# Patient Record
Sex: Female | Born: 1945 | Race: Black or African American | Hispanic: No | Smoking: Former smoker
Health system: Southern US, Community
[De-identification: ages and names within clinical notes are randomized; demographics above are authoritative.]

## PROBLEM LIST (undated history)

## (undated) DIAGNOSIS — C50919 Malignant neoplasm of unspecified site of unspecified female breast: Secondary | ICD-10-CM

## (undated) DIAGNOSIS — J302 Other seasonal allergic rhinitis: Secondary | ICD-10-CM

## (undated) DIAGNOSIS — E119 Type 2 diabetes mellitus without complications: Secondary | ICD-10-CM

## (undated) DIAGNOSIS — C801 Malignant (primary) neoplasm, unspecified: Secondary | ICD-10-CM

## (undated) DIAGNOSIS — I10 Essential (primary) hypertension: Secondary | ICD-10-CM

## (undated) DIAGNOSIS — Z923 Personal history of irradiation: Secondary | ICD-10-CM

## (undated) HISTORY — DX: Type 2 diabetes mellitus without complications: E11.9

## (undated) HISTORY — DX: Other seasonal allergic rhinitis: J30.2

## (undated) HISTORY — PX: WISDOM TOOTH EXTRACTION: SHX21

## (undated) HISTORY — PX: SALPINGOOPHORECTOMY: SHX82

## (undated) HISTORY — PX: BREAST BIOPSY: SHX20

## (undated) HISTORY — DX: Essential (primary) hypertension: I10

## (undated) HISTORY — DX: Malignant (primary) neoplasm, unspecified: C80.1

---

## 1998-12-08 ENCOUNTER — Other Ambulatory Visit: Admission: RE | Admit: 1998-12-08 | Discharge: 1998-12-08 | Payer: Self-pay | Admitting: Internal Medicine

## 1999-11-27 ENCOUNTER — Other Ambulatory Visit: Admission: RE | Admit: 1999-11-27 | Discharge: 1999-11-27 | Payer: Self-pay | Admitting: Family Medicine

## 2001-03-25 ENCOUNTER — Other Ambulatory Visit: Admission: RE | Admit: 2001-03-25 | Discharge: 2001-03-25 | Payer: Self-pay | Admitting: Obstetrics and Gynecology

## 2001-10-01 DIAGNOSIS — C801 Malignant (primary) neoplasm, unspecified: Secondary | ICD-10-CM

## 2001-10-01 HISTORY — PX: BREAST LUMPECTOMY: SHX2

## 2001-10-01 HISTORY — DX: Malignant (primary) neoplasm, unspecified: C80.1

## 2001-11-21 ENCOUNTER — Encounter: Admission: RE | Admit: 2001-11-21 | Discharge: 2001-11-21 | Payer: Self-pay | Admitting: General Surgery

## 2001-11-21 ENCOUNTER — Encounter: Payer: Self-pay | Admitting: General Surgery

## 2001-12-16 ENCOUNTER — Encounter: Payer: Self-pay | Admitting: General Surgery

## 2001-12-16 ENCOUNTER — Encounter: Admission: RE | Admit: 2001-12-16 | Discharge: 2001-12-16 | Payer: Self-pay | Admitting: General Surgery

## 2001-12-18 ENCOUNTER — Ambulatory Visit (HOSPITAL_BASED_OUTPATIENT_CLINIC_OR_DEPARTMENT_OTHER): Admission: RE | Admit: 2001-12-18 | Discharge: 2001-12-18 | Payer: Self-pay | Admitting: General Surgery

## 2001-12-18 ENCOUNTER — Encounter (INDEPENDENT_AMBULATORY_CARE_PROVIDER_SITE_OTHER): Payer: Self-pay | Admitting: *Deleted

## 2001-12-26 ENCOUNTER — Ambulatory Visit: Admission: RE | Admit: 2001-12-26 | Discharge: 2002-03-26 | Payer: Self-pay | Admitting: Radiation Oncology

## 2002-01-02 ENCOUNTER — Inpatient Hospital Stay (HOSPITAL_COMMUNITY): Admission: EM | Admit: 2002-01-02 | Discharge: 2002-01-04 | Payer: Self-pay | Admitting: Emergency Medicine

## 2002-01-02 ENCOUNTER — Encounter: Payer: Self-pay | Admitting: Emergency Medicine

## 2002-01-03 ENCOUNTER — Encounter: Payer: Self-pay | Admitting: Internal Medicine

## 2002-08-12 ENCOUNTER — Encounter: Payer: Self-pay | Admitting: General Surgery

## 2002-08-12 ENCOUNTER — Encounter: Admission: RE | Admit: 2002-08-12 | Discharge: 2002-08-12 | Payer: Self-pay | Admitting: Internal Medicine

## 2003-01-26 ENCOUNTER — Other Ambulatory Visit: Admission: RE | Admit: 2003-01-26 | Discharge: 2003-01-26 | Payer: Self-pay | Admitting: Obstetrics and Gynecology

## 2003-08-20 ENCOUNTER — Encounter: Admission: RE | Admit: 2003-08-20 | Discharge: 2003-08-20 | Payer: Self-pay | Admitting: Internal Medicine

## 2003-10-15 ENCOUNTER — Other Ambulatory Visit: Admission: RE | Admit: 2003-10-15 | Discharge: 2003-10-15 | Payer: Self-pay | Admitting: Obstetrics and Gynecology

## 2004-03-17 ENCOUNTER — Other Ambulatory Visit: Admission: RE | Admit: 2004-03-17 | Discharge: 2004-03-17 | Payer: Self-pay | Admitting: Obstetrics and Gynecology

## 2004-10-20 ENCOUNTER — Encounter: Admission: RE | Admit: 2004-10-20 | Discharge: 2004-10-20 | Payer: Self-pay | Admitting: General Surgery

## 2005-01-02 ENCOUNTER — Ambulatory Visit: Payer: Self-pay | Admitting: Gastroenterology

## 2005-01-18 ENCOUNTER — Ambulatory Visit: Payer: Self-pay | Admitting: Gastroenterology

## 2005-07-27 ENCOUNTER — Other Ambulatory Visit: Admission: RE | Admit: 2005-07-27 | Discharge: 2005-07-27 | Payer: Self-pay | Admitting: Obstetrics and Gynecology

## 2005-11-19 ENCOUNTER — Encounter: Admission: RE | Admit: 2005-11-19 | Discharge: 2005-11-19 | Payer: Self-pay | Admitting: General Surgery

## 2005-12-10 ENCOUNTER — Encounter: Admission: RE | Admit: 2005-12-10 | Discharge: 2005-12-10 | Payer: Self-pay | Admitting: General Surgery

## 2005-12-17 ENCOUNTER — Emergency Department (HOSPITAL_COMMUNITY): Admission: EM | Admit: 2005-12-17 | Discharge: 2005-12-17 | Payer: Self-pay | Admitting: Emergency Medicine

## 2006-07-29 ENCOUNTER — Other Ambulatory Visit: Admission: RE | Admit: 2006-07-29 | Discharge: 2006-07-29 | Payer: Self-pay | Admitting: Obstetrics and Gynecology

## 2006-12-16 ENCOUNTER — Encounter: Admission: RE | Admit: 2006-12-16 | Discharge: 2006-12-16 | Payer: Self-pay | Admitting: General Surgery

## 2007-01-27 ENCOUNTER — Encounter: Admission: RE | Admit: 2007-01-27 | Discharge: 2007-01-27 | Payer: Self-pay | Admitting: General Surgery

## 2007-07-17 ENCOUNTER — Encounter: Admission: RE | Admit: 2007-07-17 | Discharge: 2007-07-17 | Payer: Self-pay | Admitting: Internal Medicine

## 2007-07-31 ENCOUNTER — Other Ambulatory Visit: Admission: RE | Admit: 2007-07-31 | Discharge: 2007-07-31 | Payer: Self-pay | Admitting: Obstetrics and Gynecology

## 2008-02-13 ENCOUNTER — Encounter: Admission: RE | Admit: 2008-02-13 | Discharge: 2008-02-13 | Payer: Self-pay | Admitting: Internal Medicine

## 2008-11-11 ENCOUNTER — Other Ambulatory Visit: Admission: RE | Admit: 2008-11-11 | Discharge: 2008-11-11 | Payer: Self-pay | Admitting: Obstetrics and Gynecology

## 2009-02-18 ENCOUNTER — Encounter: Admission: RE | Admit: 2009-02-18 | Discharge: 2009-02-18 | Payer: Self-pay | Admitting: Internal Medicine

## 2009-12-20 ENCOUNTER — Encounter (INDEPENDENT_AMBULATORY_CARE_PROVIDER_SITE_OTHER): Payer: Self-pay | Admitting: *Deleted

## 2010-01-09 ENCOUNTER — Encounter (INDEPENDENT_AMBULATORY_CARE_PROVIDER_SITE_OTHER): Payer: Self-pay | Admitting: *Deleted

## 2010-03-28 ENCOUNTER — Encounter (INDEPENDENT_AMBULATORY_CARE_PROVIDER_SITE_OTHER): Payer: Self-pay | Admitting: *Deleted

## 2010-03-30 ENCOUNTER — Ambulatory Visit: Payer: Self-pay | Admitting: Gastroenterology

## 2010-03-30 ENCOUNTER — Encounter (INDEPENDENT_AMBULATORY_CARE_PROVIDER_SITE_OTHER): Payer: Self-pay | Admitting: *Deleted

## 2010-04-05 ENCOUNTER — Encounter (INDEPENDENT_AMBULATORY_CARE_PROVIDER_SITE_OTHER): Payer: Self-pay | Admitting: *Deleted

## 2010-04-07 ENCOUNTER — Encounter: Admission: RE | Admit: 2010-04-07 | Discharge: 2010-04-07 | Payer: Self-pay | Admitting: Internal Medicine

## 2010-07-25 ENCOUNTER — Encounter (INDEPENDENT_AMBULATORY_CARE_PROVIDER_SITE_OTHER): Payer: Self-pay | Admitting: *Deleted

## 2010-09-27 ENCOUNTER — Ambulatory Visit: Payer: Self-pay | Admitting: Gastroenterology

## 2010-10-04 ENCOUNTER — Ambulatory Visit: Admit: 2010-10-04 | Payer: Self-pay | Admitting: Gastroenterology

## 2010-10-31 NOTE — Letter (Signed)
Summary: Pre Visit Letter Revised  [facility]  [address] [place], [place] [address]   Phone: [phone]  Fax: [phone]        07/25/2010 MRN: [id number] RIANN OMAN [address] [address] [place], [place]  [address]             Procedure Date:  09/08/2010   Welcome to the Gastroenterology Division at [facility].    You are scheduled to see a nurse for your pre-procedure visit on 09/01/2010 at 8:00AM on the 3rd floor at [facility], [address]  We ask that you try to arrive at our office 15 minutes prior to your appointment time to allow for check-in.  Please take a minute to review the attached form.  If you answer "Yes" to one or more of the questions on the first page, we ask that you call the person listed at your earliest opportunity.  If you answer "No" to all of the questions, please complete the rest of the form and bring it to your appointment.    Your nurse visit will consist of discussing your medical and surgical history, your immediate family medical history, and your medications.   If you are unable to list all of your medications on the form, please bring the medication bottles to your appointment and we will list them.  We will need to be aware of both prescribed and over the counter drugs.  We will need to know exact dosage information as well.    Please be prepared to read and sign documents such as consent forms, a financial agreement, and acknowledgement forms.  If necessary, and with your consent, a friend or relative is welcome to sit-in on the nurse visit with you.  Please bring your insurance card so that we may make a copy of it.  If your insurance requires a referral to see a specialist, please bring your referral form from your primary care physician.  No co-pay is required for this nurse visit.     If you cannot keep your appointment, please call [phone] to cancel or reschedule prior to your appointment date.  This allows  [place] the opportunity to schedule an appointment for another patient in need of care.    Thank you for choosing  Gastroenterology for your medical needs.  We appreciate the opportunity to care for you.  Please visit [place] at our website  to learn more about our practice.  Sincerely, The Gastroenterology Division

## 2010-10-31 NOTE — Letter (Signed)
Summary: Diabetic Instructions  [facility]  [address] [place], [place] [address]   Phone: [phone]  Fax: [phone]    TICARA WANER 1946-09-01 MRN: [id number]   _  _   ORAL DIABETIC MEDICATION INSTRUCTIONS  The day before your procedure:   Take your diabetic pill as you do normally  The day of your procedure:   Do not take your diabetic pill    We will check your blood sugar levels during the admission process and again in Recovery before discharging you home  ________________________________________________________________________  _  _   INSULIN (LONG ACTING) MEDICATION INSTRUCTIONS (Lantus, NPH, 70/30, Humulin, Novolin-N)   The day before your procedure:   Take  your regular evening dose    The day of your procedure:   Do not take your morning dose

## 2010-10-31 NOTE — Letter (Signed)
Summary: Previsit letter  [facility]  [address] [place], [place] [address]   Phone: [phone]  Fax: [phone]       01/09/2010 MRN: [id number]  Holly Osborn [address] [place], [place]  [address]  Dear Ms. Robarge,  Welcome to the Gastroenterology Division at [facility].    You are scheduled to see a nurse for your pre-procedure visit on 02-21-10 at 8:00a.m. on the 3rd floor at [facility], [address].  We ask that you try to arrive at our office 15 minutes prior to your appointment time to allow for check-in.  Your nurse visit will consist of discussing your medical and surgical history, your immediate family medical history, and your medications.    Please bring a complete list of all your medications or, if you prefer, bring the medication bottles and we will list them.  We will need to be aware of both prescribed and over the counter drugs.  We will need to know exact dosage information as well.  If you are on blood thinners (Coumadin, Plavix, Aggrenox, Ticlid, etc.) please call our office today/prior to your appointment, as we need to consult with your physician about holding your medication.   Please be prepared to read and sign documents such as consent forms, a financial agreement, and acknowledgement forms.  If necessary, and with your consent, a friend or relative is welcome to sit-in on the nurse visit with you.  Please bring your insurance card so that we may make a copy of it.  If your insurance requires a referral to see a specialist, please bring your referral form from your primary care physician.  No co-pay is required for this nurse visit.     If you cannot keep your appointment, please call [phone] to cancel or reschedule prior to your appointment date.  This allows [place] the opportunity to schedule an appointment for another patient in need of care.    Thank you for choosing [facility] for your medical needs.   We appreciate the opportunity to care for you.  Please visit [place] at our website  to learn more about our practice.                     Sincerely.                                                                                                                   The Gastroenterology Division

## 2010-10-31 NOTE — Letter (Signed)
Summary: [facility] Instructions  [facility]  [address] [place], [place] [address]   Phone: [phone]  Fax: [phone]       Holly Osborn    September 26, 1946    MRN: [id number]        Procedure Day Dorna Bloom: Lenor Coffin  04/13/10     Arrival Time: 7:30am     Procedure Time: 8:30am     Location of Procedure:                    _ X_  [facility] (4th Floor)                       PREPARATION FOR COLONOSCOPY WITH MOVIPREP   Starting 5 days prior to your procedure  SATURDAY 07/09  do not eat nuts, seeds, popcorn, corn, beans, peas,  salads, or any raw vegetables.  Do not take any fiber supplements (e.g. Metamucil, Citrucel, and Benefiber).  THE DAY BEFORE YOUR PROCEDURE         DATE: [facility]  07/13  1.  Drink clear liquids the entire day-NO SOLID FOOD  2.  Do not drink anything colored red or purple.  Avoid juices with pulp.  No orange juice.  3.  Drink at least 64 oz. (8 glasses) of fluid/clear liquids during the day to prevent dehydration and help the prep work efficiently.  CLEAR LIQUIDS INCLUDE: Water Jello Ice Popsicles Tea (sugar ok, no milk/cream) Powdered fruit flavored drinks Coffee (sugar ok, no milk/cream) Gatorade Juice: apple, white grape, white cranberry  Lemonade Clear bullion, consomm, broth Carbonated beverages (any kind) Strained chicken noodle soup Hard Candy                             4.  In the morning, mix first dose of MoviPrep solution:    Empty 1 Pouch A and 1 Pouch B into the disposable container    Add lukewarm drinking water to the top line of the container. Mix to dissolve    Refrigerate (mixed solution should be used within 24 hrs)  5.  Begin drinking the prep at 5:00 p.m. The MoviPrep container is divided by 4 marks.   Every 15 minutes drink the solution down to the next mark (approximately 8 oz) until the full liter is complete.   6.  Follow completed prep with 16 oz of clear liquid of your choice  (Nothing red or purple).  Continue to drink clear liquids until bedtime.  7.  Before going to bed, mix second dose of MoviPrep solution:    Empty 1 Pouch A and 1 Pouch B into the disposable container    Add lukewarm drinking water to the top line of the container. Mix to dissolve    Refrigerate  THE DAY OF YOUR PROCEDURE      DATE: THURSDAY  07/14  Beginning at  3:30 a.m. (5 hours before procedure):         1. Every 15 minutes, drink the solution down to the next mark (approx 8 oz) until the full liter is complete.  2. Follow completed prep with 16 oz. of clear liquid of your choice.    3. You may drink clear liquids until 6:30am  (2 HOURS BEFORE PROCEDURE).   MEDICATION INSTRUCTIONS  Unless otherwise instructed, you should take regular prescription medications with a small sip of water   as early as possible the morning  of your procedure.  Diabetic patients - see separate instructions.   Additional medication instructions: Do not take Losartan/HCTZ day of procedure.         OTHER INSTRUCTIONS  You will need a responsible adult at least 63 years of age to accompany you and drive you home.   This person must remain in the waiting room during your procedure.  Wear loose fitting clothing that is easily removed.  Leave jewelry and other valuables at home.  However, you may wish to bring a book to read or  an iPod/MP3 player to listen to music as you wait for your procedure to start.  Remove all body piercing jewelry and leave at home.  Total time from sign-in until discharge is approximately 2-3 hours.  You should go home directly after your procedure and rest.  You can resume normal activities the  day after your procedure.  The day of your procedure you should not:   Drive   Make legal decisions   Operate machinery   Drink alcohol   Return to work  You will receive specific instructions about eating, activities and medications before you leave.    The  above instructions have been reviewed and explained to me by  Ezra Sites RN  March 30, 2010 9:24 AM    I fully understand and can verbalize these instructions _____________________________ Date _________

## 2010-10-31 NOTE — Letter (Signed)
Summary: LEC Cancel and No Reschedule  [facility]  [address] [place], [place] [address]   Phone: [phone]  Fax: [phone]      April 05, 2010 MRN: [id number]   PAISLEIGH MARONEY [address] [place], [place]  [address]     You recently cancelled your endoscopic procedure at the [facility] and did not reschedule for another date.    Your provider recommended this procedure for the benefit of your health.  It is very important that you reschedule it.  Failure to do so may be to the detriment of your health.  Please call us at [phone] and we will be happy to assist you with rescheduling.    If you were referred for this procedure by another physician/provider, we will notify him/her that you did not keep your appointment.   Sincerely,   [facility]

## 2010-10-31 NOTE — Miscellaneous (Signed)
Summary: LEC PV  Clinical Lists Changes  Medications: Added new medication of MOVIPREP 100 GM  SOLR (PEG-KCL-NACL-NASULF-NA ASC-C) As per prep instructions. - Signed Rx of MOVIPREP 100 GM  SOLR (PEG-KCL-NACL-NASULF-NA ASC-C) As per prep instructions.;  #1 x 0;  Signed;  Entered by: Ezra Sites RN;  Authorized by: Louis Meckel MD;  Method used: [facility]  [address] [phone]*, [address] [place], [place], [place]  [id number], Ph: [phone] or [phone], Fax: [phone] Observations: Added new observation of NKA: T (03/30/2010 8:56)    Prescriptions: MOVIPREP 100 GM  SOLR (PEG-KCL-NACL-NASULF-NA ASC-C) As per prep instructions.  #1 x 0   Entered by:   Ezra Sites RN   Authorized by:   Louis Meckel MD   Signed by:   Ezra Sites RN on 03/30/2010   Method used:   [facility]  [address] [phone]* (retail)       [address]       [place], [place]  [address]       Ph: [phone] or [phone]       Fax: [phone]   RxID:   [id number]

## 2010-10-31 NOTE — Letter (Signed)
Summary: Colonoscopy Letter  [facility]  [address] [place], [place] [address]   Phone: [phone]  Fax: [phone]      December 20, 2009 MRN: [id number]   Holly Osborn [address] [place], [place]  [address]   Dear [name],   According to your medical record, it is time for you to schedule a Colonoscopy. The American Cancer Society recommends this procedure as a method to detect early colon cancer. Patients with a family history of colon cancer, or a personal history of colon polyps or inflammatory bowel disease are at increased risk.  This letter has been generated based on the recommendations made at the time of your procedure. If you feel that in your particular situation this may no longer apply, please contact our office.  Please call our office at [phone] to schedule this appointment or to update your records at your earliest convenience.  Thank you for cooperating with [place] to provide you with the very best care possible.   Sincerely,  Barbette Hair. Arlyce Dice, M.D.  [facility]astro[facility] [phone]

## 2010-11-13 ENCOUNTER — Encounter (INDEPENDENT_AMBULATORY_CARE_PROVIDER_SITE_OTHER): Payer: Self-pay | Admitting: *Deleted

## 2010-11-15 ENCOUNTER — Encounter: Payer: Self-pay | Admitting: Gastroenterology

## 2010-11-15 ENCOUNTER — Encounter (INDEPENDENT_AMBULATORY_CARE_PROVIDER_SITE_OTHER): Payer: Self-pay | Admitting: *Deleted

## 2010-11-22 NOTE — Letter (Signed)
Summary: Diabetic Instructions  [facility]  [address] [place], [place] [address]   Phone: [phone]  Fax: [phone]    Holly Osborn 1945-12-19 MRN: [id number]   (Metformin)   ORAL DIABETIC MEDICATION INSTRUCTIONS  The day before your procedure:   Take your diabetic pill as you do normally  The day of your procedure:   Do not take your diabetic pill    We will check your blood sugar levels during the admission process and again in Recovery before discharging you home  ________________________________________________________________________    INSULIN (LONG ACTING) MEDICATION INSTRUCTIONS (Lantus, NPH, 70/30, Humulin, Novolin-N)   The day before your procedure:   Take  your regular evening dose    The day of your procedure:   Do not take your morning dose

## 2010-11-22 NOTE — Letter (Signed)
Summary: [facility] Instructions  [facility]  [address] [place], [place] [address]   Phone: [phone]  Fax: [phone]       Holly Osborn    12/10/1945    MRN: [id number]        Procedure Day Dorna Bloom: Wednesday 11-29-10     Arrival Time: 7:30 am     Procedure Time: 8:30 am     Location of Procedure:                    _x _  [facility] (4th Floor)   PREPARATION FOR COLONOSCOPY WITH MOVIPREP   Starting 5 days prior to your procedure  11-24-10  do not eat nuts, seeds, popcorn, corn, beans, peas,  salads, or any raw vegetables.  Do not take any fiber supplements (e.g. Metamucil, Citrucel, and Benefiber).  THE DAY BEFORE YOUR PROCEDURE         DATE:  11-28-10 DAY: Tuesday   1.  Drink clear liquids the entire day-NO SOLID FOOD  2.  Do not drink anything colored red or purple.  Avoid juices with pulp.  No orange juice.  3.  Drink at least 64 oz. (8 glasses) of fluid/clear liquids during the day to prevent dehydration and help the prep work efficiently.  CLEAR LIQUIDS INCLUDE: Water Jello Ice Popsicles Tea (sugar ok, no milk/cream) Powdered fruit flavored drinks Coffee (sugar ok, no milk/cream) Gatorade Juice: apple, white grape, white cranberry  Lemonade Clear bullion, consomm, broth Carbonated beverages (any kind) Strained chicken noodle soup Hard Candy                             4.  In the morning, mix first dose of MoviPrep solution:    Empty 1 Pouch A and 1 Pouch B into the disposable container    Add lukewarm drinking water to the top line of the container. Mix to dissolve    Refrigerate (mixed solution should be used within 24 hrs)  5.  Begin drinking the prep at 5:00 p.m. The MoviPrep container is divided by 4 marks.   Every 15 minutes drink the solution down to the next mark (approximately 8 oz) until the full liter is complete.   6.  Follow completed prep with 16 oz of clear liquid of your choice (Nothing red or purple).   Continue to drink clear liquids until bedtime.  7.  Before going to bed, mix second dose of MoviPrep solution:    Empty 1 Pouch A and 1 Pouch B into the disposable container    Add lukewarm drinking water to the top line of the container. Mix to dissolve    Refrigerate  THE DAY OF YOUR PROCEDURE      DATE:  11-29-10  DAY: Wednesday  Beginning at  3:30 a.m. (5 hours before procedure):         1. Every 15 minutes, drink the solution down to the next mark (approx 8 oz) until the full liter is complete.  2. Follow completed prep with 16 oz. of clear liquid of your choice.    3. You may drink clear liquids until  6:30 a.m. (2 HOURS BEFORE PROCEDURE).   MEDICATION INSTRUCTIONS  Unless otherwise instructed, you should take regular prescription medications with a small sip of water   as early as possible the morning of your procedure.  Diabetic patients - see separate instructions.   Additional medication instructions:  Do not take Losartan/HCTZ day of procedure.         OTHER INSTRUCTIONS  You will need a responsible adult at least 63 years of age to accompany you and drive you home.   This person must remain in the waiting room during your procedure.  Wear loose fitting clothing that is easily removed.  Leave jewelry and other valuables at home.  However, you may wish to bring a book to read or  an iPod/MP3 player to listen to music as you wait for your procedure to start.  Remove all body piercing jewelry and leave at home.  Total time from sign-in until discharge is approximately 2-3 hours.  You should go home directly after your procedure and rest.  You can resume normal activities the  day after your procedure.  The day of your procedure you should not:   Drive   Make legal decisions   Operate machinery   Drink alcohol   Return to work  You will receive specific instructions about eating, activities and medications before you leave.    The above  instructions have been reviewed and explained to me by   Ezra Sites RN  November 15, 2010 9:30 AM     I fully understand and can verbalize these instructions _____________________________ Date _________

## 2010-11-22 NOTE — Miscellaneous (Signed)
Summary: LEC PV  Clinical Lists Changes  Medications: Added new medication of MOVIPREP 100 GM  SOLR (PEG-KCL-NACL-NASULF-NA ASC-C) As per prep instructions. - Signed Rx of MOVIPREP 100 GM  SOLR (PEG-KCL-NACL-NASULF-NA ASC-C) As per prep instructions.;  #1 x 0;  Signed;  Entered by: Ezra Sites RN;  Authorized by: Louis Meckel MD;  Method used: [facility]  [address] [phone]*, [address] [place], [place], [place]  [id number], Ph: [phone] or [phone], Fax: [phone] Observations: Added new observation of NKA: T (11/15/2010 9:02)    Prescriptions: MOVIPREP 100 GM  SOLR (PEG-KCL-NACL-NASULF-NA ASC-C) As per prep instructions.  #1 x 0   Entered by:   Ezra Sites RN   Authorized by:   Louis Meckel MD   Signed by:   Ezra Sites RN on 11/15/2010   Method used:   [facility] to        [facility]  [address] [phone]* (retail)       [address]       [place], [place]  [id number]       Ph: [phone] or [phone]       Fax: [phone]   RxID:   [id number]

## 2010-11-29 ENCOUNTER — Other Ambulatory Visit: Payer: Self-pay | Admitting: Gastroenterology

## 2010-11-29 ENCOUNTER — Other Ambulatory Visit (AMBULATORY_SURGERY_CENTER): Payer: BC Managed Care – PPO | Admitting: Gastroenterology

## 2010-11-29 DIAGNOSIS — Z86010 Personal history of colonic polyps: Secondary | ICD-10-CM

## 2010-11-29 DIAGNOSIS — Z1211 Encounter for screening for malignant neoplasm of colon: Secondary | ICD-10-CM

## 2010-11-29 DIAGNOSIS — Z800 Family history of malignant neoplasm of digestive organs: Secondary | ICD-10-CM

## 2010-11-29 DIAGNOSIS — D126 Benign neoplasm of colon, unspecified: Secondary | ICD-10-CM

## 2010-11-29 LAB — GLUCOSE, CAPILLARY: Glucose-Capillary: 215 mg/dL — ABNORMAL HIGH (ref 70–99)

## 2010-12-04 ENCOUNTER — Encounter: Payer: Self-pay | Admitting: Gastroenterology

## 2010-12-07 NOTE — Procedures (Addendum)
Summary: Colonoscopy  Patient: Holly Osborn Note: All result statuses are Final unless otherwise noted.  Tests: (1) Colonoscopy (COL)   COL Colonoscopy           DONE     [facility]     [address]     [place], [place]  [address]          COLONOSCOPY PROCEDURE REPORT          PATIENT:  Holly Osborn, Holly Osborn  MR#:  [id number]     BIRTHDATE:  October 30, 1945, 64 yrs. old  GENDER:  female          ENDOSCOPIST:  Barbette Hair. Arlyce Dice, MD     Referred by:  Chilton Greathouse, M.D.          PROCEDURE DATE:  11/29/2010     PROCEDURE:  Colon with cold biopsy polypectomy     ASA CLASS:  Class II     INDICATIONS:  1) screening  2) family history of colon cancer  3)     history of pre-cancerous (adenomatous) colon polyps Father and     brother          MEDICATIONS:   Fentanyl 100 mcg IV, Versed 10 mg IV          DESCRIPTION OF PROCEDURE:   After the risks benefits and     alternatives of the procedure were thoroughly explained, informed     consent was obtained.  Digital rectal exam was performed and     revealed no abnormalities.   The LB CF-[id number] endoscope     was introduced through the anus and advanced to the cecum, which     was identified by both the appendix and ileocecal valve, without     limitations.  The quality of the prep was good, using MoviPrep.     The instrument was then slowly withdrawn as the colon was fully     examined.     <<PROCEDUREIMAGES>>          FINDINGS:  A sessile polyp was found in the ascending colon. It     was 3 mm in size. The polyp was removed using cold biopsy forceps     (see image8).  Moderate diverticulosis was found in the ascending     colon (see image3).  Moderate diverticulosis was found in the     sigmoid colon (see image12).  Scattered diverticula were found.     descending to transverse colon  This was otherwise a normal     examination of the colon (see image5, image6, image9, image10,     image14, and image15).    Retroflexed views in the rectum revealed     no abnormalities.    The time to cecum =  4.50  minutes. The scope     was then withdrawn (time =  11.25  min) from the patient and the     procedure completed.          COMPLICATIONS:  None          ENDOSCOPIC IMPRESSION:     1) 3 mm sessile polyp in the ascending colon     2) Moderate diverticulosis in the ascending colon     3) Moderate diverticulosis in the sigmoid colon     4) Diverticula, scattered     5) Otherwise normal examination     RECOMMENDATIONS:     1) Given your significant family history of colon cancer, you  should have a repeat colonoscopy in 5 years          REPEAT EXAM:   5 year(s) Colonoscopy          ______________________________     Barbette Hair. Arlyce Dice, MD          CC:          n.     eSIGNED:   Barbette Hair. Kaplan at 11/29/2010 09:25 AM          Pixler, Junious Dresser, [id number]  Note: An exclamation mark (!) indicates a result that was not dispersed into the flowsheet. Document Creation Date: 11/29/2010 9:25 AM _______________________________________________________________________  (1) Order result status: Final Collection or observation date-time: 11/29/2010 09:15 Requested date-time:  Receipt date-time:  Reported date-time:  Referring Physician:   Ordering Physician: Melvia Heaps [phone]) Specimen Source:  Source: Launa Grill Order Number: [id number] Lab site:   Appended Document: Colonoscopy     Procedures Next Due Date:    Colonoscopy: 11/2015

## 2010-12-12 NOTE — Letter (Signed)
Summary: Results Letter   [facility]  [address] [place], [place] [address]   Phone: [phone]  Fax: [phone]        December 04, 2010 MRN: [id number]    [name] [address] [place], [place]  [address]    Dear Holly Osborn,  Your colon biopsy results did not show any remarkable findings.  In view of your family history of colon cancer, however, I recommend followup colonoscopy in 5 years.  Please continue with the recommendations previously discussed.  Should you have any further questions or immediate concers, feel free to contact me.  Sincerely,  Barbette Hair. Arlyce Dice, M.D., [facility]          Sincerely,  Louis Meckel MD  This letter has been electronically signed by your physician.  Appended Document: Results Letter letter mailed

## 2011-02-16 NOTE — Discharge Summary (Signed)
[place]. [facility]  Patient:    Holly Osborn, Holly Osborn Visit Number: [id number] MRN: [id number]          Service Type: MED Location: [id number] Attending Physician:  Hoyle Sauer Dictated by:   Gwen Pounds, M.D. Admit Date:  01/02/2002 Discharge Date: 01/04/2002   CC:         Ravi R. Felipa Eth, M.D., primary care  Rose Phi. Maple Hudson, M.D.   Discharge Summary  DISCHARGE DIAGNOSES: 1. Right upper lobe community acquired pneumonia. 2. Mild hemoptysis. 3. Tobacco abuse now in the process of quitting. 4. Seasonal allergic rhinitis. 5. Diverticulosis. 6. Colon polyps. 7. Degenerative joint disease. 8. Hypertension with diastolic dysfunction and left ventricular hypertrophy. 9. History of Pagets breast disease, status post resection of high grade    malignancy by Rose Phi. Young, M.D., in March 2003, with pending therapy    to consist of radiation therapy and possibly tamoxifen.  DISCHARGE MEDICATIONS: 1. Azithromycin 500 mg p.o. q.d. for two more days. 2. Augmentin 875 mg p.o. b.i.d. x11 more days. 3. Tiazac 300 q.d. 4. Allegra 60 q.d. 5. Celebrex 100 q.d. 6. Avalide 300/12.5 q.d. to be restarted in one week. 7. Tylenol and/or increase Celebrex for pleuritic chest pain.  DISCHARGE PROCEDURES:  Chest x-ray and CT scan.  The CT scan was a helical scan dated January 03, 2002, revealing right upper lobe pneumonia with tiny right pleural effusion.  HISTORY OF PRESENT ILLNESS:  Briefly, The patient is a very pleasant 65 year old African American female with history of tobacco abuse and diastolic dysfunction who is status post excision of Pagets breast disease who presented for medical attention on January 02, 2002, with two or three day progressive history of pleuritic chest pain associated with cough, dyspnea, fever and myalgias.  Also associated, here nausea, vomiting, and diarrhea. The patient was worked up and it was noted that she had leukocytosis and  a right upper lobe community acquired pneumonia.  HOSPITAL COURSE:  The patient was admitted to the hospital with community acquired pneumonia. She was given IV fluids, IV antibiotics and oxygen. She progressed nicely.  She developed hemoptysis and a helical CT scan was obtained which did not show any malignancy or lymphadenopathy. It only showed the right upper lobe pneumonia with a tiny right pleural effusion.  On the date of January 04, 2002, she was up moving about in her room.  Her oxygen saturations were in the mid to upper 90s.  Her leukocytosis was improving. Her white blood cell count on the date of discharge was 13.9 with much less of a left shift.  It was decided on January 04, 2002, that she was improving markedly, that she could easily switched over to oral antibiotics, and that she could be discharged safely.  She is being discharged in stable condition.  DISCHARGE ACTIVITY:  She is to increase her activity slowly.  AFTER CARE FOLLOW-UP INSTRUCTIONS:  She is to continue off cigarettes and never smoke again.  She is to follow up with Ravi R. Avva, M.D., in about seven to 14 days or sooner if she worsens.  She is to have a chest x-ray after the pneumonia is resolved to prove resolution. Dictated by:   Gwen Pounds, M.D. Attending Physician:  Hoyle Sauer DD:  01/04/02 TD:  01/05/02 Job: [id number]

## 2011-02-16 NOTE — H&P (Signed)
[place]. [facility]  Patient:    Holly Osborn, Holly Osborn Visit Number: [id number] MRN: [id number]          Service Type: EMS Location: [place] Attending Physician:  Doug Sou Dictated by:   Lennon Alstrom. Felipa Eth, M.D. Admit Date:  01/02/2002   CC:         Rose Phi. Maple Hudson, M.D.   History and Physical  CHIEF COMPLAINT:  Nausea, vomiting, diarrhea, myalgias, dyspnea, and fever progressive x2-3 days.  HISTORY OF PRESENT ILLNESS:  This is a 65 year old African-American female with a history of tobacco abuse, degenerative joint disease, seasonal allergic rhinitis, hypertension with diastolic dysfunction, and a recent diagnosis of Pagets disease status post excision by Dr. Francina Ames in March 2003.  Biopsy margins were clean of any malignancy.  Patient now presents with a progressive two to three-day history of pleuritic chest pain associated with minimal cough, dyspnea, fever, and myalgias associated with nausea, vomiting, and diarrhea.  The patient states that she has been off of her tobacco for approximately two weeks but has been plagued by increasing seasonal allergic rhinitis symptoms for the last one week.  The patient presented to the emergency room where evaluation revealed leukocytosis and a right upper lobe pneumonia.  Blood cultures x2 were obtained and patient was started empirically on azithromycin and Rocephin for community-acquired pneumonia, and she is now admitted for further management and care on an inpatient basis.  REVIEW OF SYSTEMS:  Positive for fevers, chills, malaise, nausea, vomiting, diarrhea without blood, and myalgias.  Review of systems is negative for chest pain, dysuria, frequency, abdominal pain, or new musculoskeletal deficits. Patient states that she has not had problems with the excision status post surgery for the resection of her Pagets disease from the breast.  SOCIAL HISTORY:  The patient divorced approximately 30 years ago, is  an Environmental health practitioner at [employer].  Smokes approximately one pack per day x18 years but has quit over the last two weeks. Has occasional use of alcohol.  FAMILY HISTORY:  Significant for colon cancer, hypertension, dementia, questionable brain tumor.  PROBLEM LIST: 1. Tobacco abuse. 2. History of bilateral tubal ligation. 3. Seasonal allergic rhinitis. 4. Family history of colon cancer with the last colonoscopy in March 2003 by    Dr. Arlyce Dice revealing diverticulosis and colon polyps. 5. 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 6. Hypertension associated with diastolic dysfunction and left ventricular    hypertrophy by echocardiogram. 7. Pagets disease of the breast status post resection of a high-grade    malignancy by Dr. Francina Ames in March 2003 with pending therapy to    consist of radiation therapy and possibly Tamoxifen.  CURRENT MEDICATIONS: 1. Allegra 60 mg p.o. q.d. 2. Celebrex 100 mg p.o. q.d. 3. Cyclobenzaprine 10 mg q.d. 4. Alprazolam 0.5 mg p.r.n. 5. Tiazac 300 mg p.o. q.d. 6. Avalide 300 mg/12.5 mg one p.o. q.d.  ALLERGIES:  No known drug allergies but a history of KLONODINE resulting in fatigue.  PHYSICAL EXAMINATION:  GENERAL:  We have an obese African-American female who expressed general malaise but has no respiratory distress on room air and talking in full sentences.  VITAL SIGNS:  Blood pressure 138/56, temperature 101.5 degrees Fahrenheit, respirations 22 and nonlabored, oxygen saturation 92% on room air, pulse 80 and regular.  HEENT:  Head:  Normocephalic, atraumatic.  Eye:  Sclerae anicteric. Extraocular movements are intact and pupils are equal and reactive to light and accommodation.  ENT:  There are no  oropharyngeal lesions or sinus tenderness.  NECK:  Supple.  There is no thyromegaly, no cervical lymphadenopathy.  LUNGS:  Reveal coarse breath sounds with mild rhonchi right greater than left, but no  bronchospasm.  CARDIOVASCULAR:  Reveals a regular rate and rhythm.  ABDOMEN:  Reveals a soft, nontender, nondistended abdomen with bowel sounds present throughout.  GENITOURINARY AND RECTAL:  Deferred.  EXTREMITIES:  Reveal no edema with 2+ pulses in all four extremities.  NEUROLOGIC:  Grossly nonfocal.  LABORATORY DATA:  Unremarkable urinalysis.  Sodium 137, potassium 3.2, BUN 12, creatinine 1.2, glucose 156, total protein 7.7, albumin 3.4, AST 33, ALT 53, alkaline phosphatase 105, total bilirubin 1.2.  White blood cell count 19.3, elevated with a left shift consisting of 91% neutrophils; hemoglobin 13.7, hematocrit 39.5%, platelet count 256.  Portable chest x-ray reveals right upper lobe infiltrate.  Blood cultures x2 have been obtained in the emergency room prior to the initiation of azithromycin and Rocephin.  ASSESSMENT AND PLAN: 1. We have community-acquired pneumonia with a history of tobacco abuse.  Will    follow up on blood cultures x2.  Check O2 saturation each shift x48 hours    for the possibility of hypoxia.  Will continue azithromycin and Rocephin    empirically and observe for bronchospasm.  Will provide mucolytic agents. 2. Hypokalemia, probably secondary to nausea, vomiting, and diarrhea.  Will    provide IV fluids consisting of half normal saline with 20 mEq of KCl per    liter and give a one-time dose of K-Dur orally, and will recheck in the    morning. 3. Hypertension with diastolic dysfunction.  Will continue calcium channel    blocker with Tiazac and with the possibility of re-adding Avalide if blood    pressure exceeds normal limits. 4. Stress management.  Will continue Xanax as needed to include use for    insomnia if this occurs during hospitalization. 5. Myalgias.  Will continue Celebrex, especially in light of her degenerative    joint disease. 6. Seasonal allergic rhinitis.  Will continue Allegra; however, will increase     the dose to 180 mg  each day and add Rhinocort Aqua nasal spray, and observe    for improvement. Dictated by:   Lennon Alstrom Felipa Eth, M.D. Attending Physician:  Doug Sou DD:  01/02/02 TD:  01/02/02 Job: [id number]

## 2011-02-16 NOTE — Op Note (Signed)
[place]. [facility]  Patient:    DARSHANA, CURNUTT Visit Number: [id number] MRN: [id number]          Service Type: DSU Location: [facility] Attending Physician:  Janalyn Rouse Dictated by:   Rose Phi. Maple Hudson, M.D. Proc. Date: 12/18/01 Admit Date:  12/18/2001   CC:         Ravi R. Felipa Eth, M.D.  [facility] [place], [place]   Operative Report  PREOPERATIVE DIAGNOSIS:  Padgets disease of the right nipple.  POSTOPERATIVE DIAGNOSIS:  Padgets disease of the right nipple.  OPERATION PERFORMED:  Right partial mastectomy.  SURGEON:  Rose Phi. Maple Hudson, M.D.  ANESTHESIA:  General.  DESCRIPTION OF PROCEDURE:  This patient had presented with an excoriation of the right nipple, which on biopsy had shown Padgets disease.  Careful mammography had shown some linear and pleomorphic microcalcifications just in the subareolar portion of the right breast with no other abnormalities within the breast.  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  After suitable general anesthesia was induced, the patient was placed in supine position with the right arm extended on the arm board.  The right breast was prepped and draped in the usual fashion.  An elliptical incision incorporating the nipple and the very large areola that she had was then outlined and incision made and then I took the nipple areolar complex and the deeper tissue going at least midway down to the depths of the breast. Hemostasis was obtained with the cautery.  I injected with 0.25% Marcaine. The deeper tissue was then approximated with 3-0 Vicryl and then a subcuticular closure with 4-0 Monocryl was carried out along with Steri-Strips.  Dressings were then applied.  The patient was transferred to the  recovery room in satisfactory condition having tolerated the procedure well. Dictated by:   Rose Phi. Maple Hudson, M.D. Attending Physician:  Janalyn Rouse DD:  12/18/01 TD:  12/18/01 Job: [id number]

## 2011-03-20 ENCOUNTER — Other Ambulatory Visit: Payer: Self-pay | Admitting: Internal Medicine

## 2011-03-20 DIAGNOSIS — Z1231 Encounter for screening mammogram for malignant neoplasm of breast: Secondary | ICD-10-CM

## 2011-04-11 ENCOUNTER — Ambulatory Visit
Admission: RE | Admit: 2011-04-11 | Discharge: 2011-04-11 | Disposition: A | Discharge status: Home / Self Care | Payer: BC Managed Care – PPO | Source: Ambulatory Visit | Attending: Internal Medicine | Admitting: Internal Medicine

## 2011-04-11 DIAGNOSIS — Z1231 Encounter for screening mammogram for malignant neoplasm of breast: Secondary | ICD-10-CM

## 2012-01-23 ENCOUNTER — Other Ambulatory Visit: Payer: Self-pay | Admitting: Internal Medicine

## 2012-01-23 DIAGNOSIS — N644 Mastodynia: Secondary | ICD-10-CM

## 2012-01-28 ENCOUNTER — Other Ambulatory Visit: Payer: BC Managed Care – PPO

## 2012-02-01 ENCOUNTER — Other Ambulatory Visit: Payer: BC Managed Care – PPO

## 2012-02-06 ENCOUNTER — Ambulatory Visit
Admission: RE | Admit: 2012-02-06 | Discharge: 2012-02-06 | Disposition: A | Discharge status: Home / Self Care | Payer: BC Managed Care – PPO | Source: Ambulatory Visit | Attending: Internal Medicine | Admitting: Internal Medicine

## 2012-02-06 DIAGNOSIS — N644 Mastodynia: Secondary | ICD-10-CM

## 2012-03-20 ENCOUNTER — Other Ambulatory Visit: Payer: Self-pay | Admitting: Internal Medicine

## 2012-03-20 DIAGNOSIS — Z1231 Encounter for screening mammogram for malignant neoplasm of breast: Secondary | ICD-10-CM

## 2012-04-21 ENCOUNTER — Ambulatory Visit: Payer: BC Managed Care – PPO

## 2012-04-29 ENCOUNTER — Ambulatory Visit: Payer: BC Managed Care – PPO

## 2012-05-05 ENCOUNTER — Ambulatory Visit
Admission: RE | Admit: 2012-05-05 | Discharge: 2012-05-05 | Disposition: A | Discharge status: Home / Self Care | Payer: BC Managed Care – PPO | Source: Ambulatory Visit | Attending: Internal Medicine | Admitting: Internal Medicine

## 2012-05-05 DIAGNOSIS — Z1231 Encounter for screening mammogram for malignant neoplasm of breast: Secondary | ICD-10-CM

## 2013-04-24 ENCOUNTER — Other Ambulatory Visit: Payer: Self-pay

## 2013-04-24 DIAGNOSIS — Z1231 Encounter for screening mammogram for malignant neoplasm of breast: Secondary | ICD-10-CM

## 2013-05-15 ENCOUNTER — Ambulatory Visit: Payer: BC Managed Care – PPO

## 2013-06-26 ENCOUNTER — Ambulatory Visit: Payer: BC Managed Care – PPO

## 2013-07-24 ENCOUNTER — Ambulatory Visit
Admission: RE | Admit: 2013-07-24 | Discharge: 2013-07-24 | Disposition: A | Discharge status: Home / Self Care | Payer: Medicare Other | Source: Ambulatory Visit

## 2013-07-24 DIAGNOSIS — Z1231 Encounter for screening mammogram for malignant neoplasm of breast: Secondary | ICD-10-CM

## 2013-07-28 ENCOUNTER — Other Ambulatory Visit: Payer: Self-pay | Admitting: Internal Medicine

## 2013-07-28 DIAGNOSIS — R928 Other abnormal and inconclusive findings on diagnostic imaging of breast: Secondary | ICD-10-CM

## 2013-09-02 ENCOUNTER — Ambulatory Visit
Admission: RE | Admit: 2013-09-02 | Discharge: 2013-09-02 | Disposition: A | Discharge status: Home / Self Care | Payer: Medicare Other | Source: Ambulatory Visit | Attending: Internal Medicine | Admitting: Internal Medicine

## 2013-09-02 DIAGNOSIS — R928 Other abnormal and inconclusive findings on diagnostic imaging of breast: Secondary | ICD-10-CM

## 2014-08-05 ENCOUNTER — Other Ambulatory Visit: Payer: Self-pay

## 2014-08-05 DIAGNOSIS — Z1231 Encounter for screening mammogram for malignant neoplasm of breast: Secondary | ICD-10-CM

## 2014-09-13 ENCOUNTER — Ambulatory Visit
Admission: RE | Admit: 2014-09-13 | Discharge: 2014-09-13 | Disposition: A | Discharge status: Home / Self Care | Payer: 59 | Source: Ambulatory Visit

## 2014-09-13 DIAGNOSIS — Z1231 Encounter for screening mammogram for malignant neoplasm of breast: Secondary | ICD-10-CM

## 2014-10-20 ENCOUNTER — Other Ambulatory Visit: Payer: Self-pay | Admitting: Dermatology

## 2015-01-20 ENCOUNTER — Encounter: Payer: Self-pay | Admitting: Gastroenterology

## 2015-08-10 ENCOUNTER — Other Ambulatory Visit: Payer: Self-pay

## 2015-08-10 DIAGNOSIS — Z1231 Encounter for screening mammogram for malignant neoplasm of breast: Secondary | ICD-10-CM

## 2015-08-19 ENCOUNTER — Encounter: Payer: Self-pay | Admitting: Gastroenterology

## 2015-08-31 ENCOUNTER — Ambulatory Visit: Payer: Self-pay | Admitting: Podiatry

## 2015-09-13 ENCOUNTER — Encounter: Payer: Self-pay | Admitting: Podiatry

## 2015-09-13 ENCOUNTER — Ambulatory Visit (INDEPENDENT_AMBULATORY_CARE_PROVIDER_SITE_OTHER): Payer: Medicare Other | Admitting: Podiatry

## 2015-09-13 VITALS — BP 158/69 | HR 72 | Resp 12

## 2015-09-13 DIAGNOSIS — L609 Nail disorder, unspecified: Secondary | ICD-10-CM

## 2015-09-13 DIAGNOSIS — L608 Other nail disorders: Secondary | ICD-10-CM

## 2015-09-13 DIAGNOSIS — E119 Type 2 diabetes mellitus without complications: Secondary | ICD-10-CM | POA: Diagnosis not present

## 2015-09-13 NOTE — Progress Notes (Signed)
   Subjective:    Patient ID: Holly Osborn, female    DOB: 1946/05/08, 69 y.o.   MRN: [id number]  HPI    This diabetic patient presents today complaining of long toenails that are uncomfortable when she walks and wear shoes. The symptoms are chronic and in the past patient has visit pedicures for nail trimming. She denies any history of foot infection or podiatric care. She especially uncomfortable in the medial margin of the left hallux toenail  Patient denies any history of foot ulceration, claudication or amputation  Review of Systems  HENT: Positive for sinus pressure.   Musculoskeletal: Positive for myalgias and back pain.       Objective:   Physical Exam  Orientated 3  Vascular: DP and PT pulses 2/4 bilaterally Capillary reflex immediate bilaterally  Neurological: Sensation to 10 g monofilament wire intact 5/5 bilaterally Vibratory sensation reactive bilaterally Ankle reflex equal and reactive bilaterally  Dermatological: No open skin lesions bilaterally The toenails are elongated and incurvated 6-10 Callus medial nail groove left hallux Plantar keratoses sub-second and third MPJ right with minimal thickening and appear to have been trimmed to her sanded  Musculoskeletal: Hammertoe deformities 2-4 bilaterally There is no crepitus or pain upon range of motion of ankle, subtalar, midtarsal joints bilaterally      Assessment & Plan:   Assessment: Diabetic without complications Incurvated toenails 6-10 Callus medial nail groove left hallux Plantar calluses bilaterally  Plan: Today review sedimentation with patient today. I did debrided toenails mechanically an electrical without a bleeding. I made aware that she could go to the pedicurist as long she did not soak her feet in whirlpool bath or half the cuticle was manipulated  Reappoint when necessary or yearly

## 2015-09-13 NOTE — Patient Instructions (Signed)
Today your diabetic foot examination demonstrated adequate circulation and feeling in your feet The toenails are mildly curled with a callused nail groove on the left Okay to have nails trimmed at the salon, do not use whirlpool and do not manipulate the cuticles Return as needed or yearly Diabetes and Foot Care Diabetes may cause you to have problems because of poor blood supply (circulation) to your feet and legs. This may cause the skin on your feet to become thinner, break easier, and heal more slowly. Your skin may become dry, and the skin may peel and crack. You may also have nerve damage in your legs and feet causing decreased feeling in them. You may not notice minor injuries to your feet that could lead to infections or more serious problems. Taking care of your feet is one of the most important things you can do for yourself.  HOME CARE INSTRUCTIONS  Wear shoes at all times, even in the house. Do not go barefoot. Bare feet are easily injured.  Check your feet daily for blisters, cuts, and redness. If you cannot see the bottom of your feet, use a mirror or ask someone for help.  Wash your feet with warm water (do not use hot water) and mild soap. Then pat your feet and the areas between your toes until they are completely dry. Do not soak your feet as this can dry your skin.  Apply a moisturizing lotion or petroleum jelly (that does not contain alcohol and is unscented) to the skin on your feet and to dry, brittle toenails. Do not apply lotion between your toes.  Trim your toenails straight across. Do not dig under them or around the cuticle. File the edges of your nails with an emery board or nail file.  Do not cut corns or calluses or try to remove them with medicine.  Wear clean socks or stockings every day. Make sure they are not too tight. Do not wear knee-high stockings since they may decrease blood flow to your legs.  Wear shoes that fit properly and have enough cushioning. To  break in new shoes, wear them for just a few hours a day. This prevents you from injuring your feet. Always look in your shoes before you put them on to be sure there are no objects inside.  Do not cross your legs. This may decrease the blood flow to your feet.  If you find a minor scrape, cut, or break in the skin on your feet, keep it and the skin around it clean and dry. These areas may be cleansed with mild soap and water. Do not cleanse the area with peroxide, alcohol, or iodine.  When you remove an adhesive bandage, be sure not to damage the skin around it.  If you have a wound, look at it several times a day to make sure it is healing.  Do not use heating pads or hot water bottles. They may burn your skin. If you have lost feeling in your feet or legs, you may not know it is happening until it is too late.  Make sure your health care provider performs a complete foot exam at least annually or more often if you have foot problems. Report any cuts, sores, or bruises to your health care provider immediately. SEEK MEDICAL CARE IF:   You have an injury that is not healing.  You have cuts or breaks in the skin.  You have an ingrown nail.  You notice redness on your  legs or feet.  You feel burning or tingling in your legs or feet.  You have pain or cramps in your legs and feet.  Your legs or feet are numb.  Your feet always feel cold. SEEK IMMEDIATE MEDICAL CARE IF:   There is increasing redness, swelling, or pain in or around a wound.  There is a red line that goes up your leg.  Pus is coming from a wound.  You develop a fever or as directed by your health care provider.  You notice a bad smell coming from an ulcer or wound.   This information is not intended to replace advice given to you by your health care provider. Make sure you discuss any questions you have with your health care provider.   Document Released: 09/14/2000 Document Revised: 05/20/2013 Document  Reviewed: 02/24/2013 Elsevier Interactive Patient Education [employer].

## 2015-09-16 ENCOUNTER — Ambulatory Visit
Admission: RE | Admit: 2015-09-16 | Discharge: 2015-09-16 | Disposition: A | Discharge status: Home / Self Care | Payer: Medicare Other | Source: Ambulatory Visit

## 2015-09-16 DIAGNOSIS — Z1231 Encounter for screening mammogram for malignant neoplasm of breast: Secondary | ICD-10-CM

## 2015-11-25 ENCOUNTER — Encounter: Payer: Self-pay | Admitting: Gastroenterology

## 2015-12-27 ENCOUNTER — Encounter: Payer: Self-pay | Admitting: Gastroenterology

## 2016-02-15 ENCOUNTER — Ambulatory Visit (AMBULATORY_SURGERY_CENTER): Payer: Self-pay

## 2016-02-15 VITALS — Ht 70.0 in | Wt 218.4 lb

## 2016-02-15 DIAGNOSIS — Z860100 Personal history of colon polyps, unspecified: Secondary | ICD-10-CM

## 2016-02-15 MED ORDER — SUPREP BOWEL PREP KIT 17.5-3.13-1.6 GM/177ML PO SOLN: 1.0000 | Freq: Once | ORAL | Status: DC

## 2016-02-15 NOTE — Progress Notes (Signed)
No allergies to eggs or soy No diet meds No home oxygen No past problems with anesthesia  Has email and internet; declined emmi 

## 2016-02-16 ENCOUNTER — Encounter: Payer: Self-pay | Admitting: Gastroenterology

## 2016-02-29 ENCOUNTER — Encounter: Payer: Self-pay | Admitting: Gastroenterology

## 2016-02-29 ENCOUNTER — Ambulatory Visit (AMBULATORY_SURGERY_CENTER): Payer: Medicare Other | Admitting: Gastroenterology

## 2016-02-29 VITALS — BP 151/73 | HR 68 | Temp 97.5°F | Resp 14 | Ht 70.0 in | Wt 218.0 lb

## 2016-02-29 DIAGNOSIS — Z860100 Personal history of colon polyps, unspecified: Secondary | ICD-10-CM

## 2016-02-29 DIAGNOSIS — D123 Benign neoplasm of transverse colon: Secondary | ICD-10-CM

## 2016-02-29 DIAGNOSIS — D128 Benign neoplasm of rectum: Secondary | ICD-10-CM | POA: Diagnosis not present

## 2016-02-29 DIAGNOSIS — K633 Ulcer of intestine: Secondary | ICD-10-CM | POA: Diagnosis not present

## 2016-02-29 DIAGNOSIS — K635 Polyp of colon: Secondary | ICD-10-CM | POA: Diagnosis not present

## 2016-02-29 DIAGNOSIS — K621 Rectal polyp: Secondary | ICD-10-CM

## 2016-02-29 LAB — GLUCOSE, CAPILLARY
GLUCOSE-CAPILLARY: 160 mg/dL — AB (ref 65–99)
GLUCOSE-CAPILLARY: 121 mg/dL — AB (ref 65–99)

## 2016-02-29 MED ORDER — SODIUM CHLORIDE 0.9 % IV SOLN: 500.0000 mL | INTRAVENOUS | Status: DC

## 2016-02-29 NOTE — Progress Notes (Signed)
Report to PACU, RN, vss, BBS= Clear.  

## 2016-02-29 NOTE — Patient Instructions (Signed)
YOU HAD AN ENDOSCOPIC PROCEDURE TODAY AT [facility]:   Refer to the procedure report that was given to you for any specific questions about what was found during the examination.  If the procedure report does not answer your questions, please call your gastroenterologist to clarify.  If you requested that your care partner not be given the details of your procedure findings, then the procedure report has been included in a sealed envelope for you to review at your convenience later.  YOU SHOULD EXPECT: Some feelings of bloating in the abdomen. Passage of more gas than usual.  Walking can help get rid of the air that was put into your GI tract during the procedure and reduce the bloating. If you had a lower endoscopy (such as a colonoscopy or flexible sigmoidoscopy) you may notice spotting of blood in your stool or on the toilet paper. If you underwent a bowel prep for your procedure, you may not have a normal bowel movement for a few days.  Please Note:  You might notice some irritation and congestion in your nose or some drainage.  This is from the oxygen used during your procedure.  There is no need for concern and it should clear up in a day or so.  SYMPTOMS TO REPORT IMMEDIATELY:   Following lower endoscopy (colonoscopy or flexible sigmoidoscopy):  Excessive amounts of blood in the stool  Significant tenderness or worsening of abdominal pains  Swelling of the abdomen that is new, acute  Fever of 100F or higher   For urgent or emergent issues, a gastroenterologist can be reached at any hour by calling [phone].   DIET: Your first meal following the procedure should be a small meal and then it is ok to progress to your normal diet. Heavy or fried foods are harder to digest and may make you feel nauseous or bloated.  Likewise, meals heavy in dairy and vegetables can increase bloating.  Drink plenty of fluids but you should avoid alcoholic beverages for 24  hours.  ACTIVITY:  You should plan to take it easy for the rest of today and you should NOT DRIVE or use heavy machinery until tomorrow (because of the sedation medicines used during the test).    FOLLOW UP: Our staff will call the number listed on your records the next business day following your procedure to check on you and address any questions or concerns that you may have regarding the information given to you following your procedure. If we do not reach you, we will leave a message.  However, if you are feeling well and you are not experiencing any problems, there is no need to return our call.  We will assume that you have returned to your regular daily activities without incident.  If any biopsies were taken you will be contacted by phone or by letter within the next 1-3 weeks.  Please call us at [phone] if you have not heard about the biopsies in 3 weeks.    SIGNATURES/CONFIDENTIALITY: You and/or your care partner have signed paperwork which will be entered into your electronic medical record.  These signatures attest to the fact that that the information above on your After Visit Summary has been reviewed and is understood.  Full responsibility of the confidentiality of this discharge information lies with you and/or your care-partner.  Polyp, diverticulosis, high fiber diet information given.  Hemorrhoid information given.  NO NSAIDS FOR 3 MONTHS.  Baby aspirin and tylenol OK per  Dr. Silverio Decamp.

## 2016-02-29 NOTE — Op Note (Signed)
[place] Patient Name: Holly Osborn Procedure Date: 02/29/2016 9:43 AM MRN: [id number] Endoscopist: Mauri Pole , MD Age: 70 Referring MD:  Date of Birth: 01-07-1946 Gender: Female Procedure:                Colonoscopy Indications:              Surveillance: Personal history of adenomatous                            polyps on last colonoscopy 5 years ago Medicines:                Monitored Anesthesia Care Procedure:                Pre-Anesthesia Assessment:                           - Prior to the procedure, a History and Physical                            was performed, and patient medications and                            allergies were reviewed. The patient's tolerance of                            previous anesthesia was also reviewed. The risks                            and benefits of the procedure and the sedation                            options and risks were discussed with the patient.                            All questions were answered, and informed consent                            was obtained. Prior Anticoagulants: The patient has                            taken no previous anticoagulant or antiplatelet                            agents. ASA Grade Assessment: II - A patient with                            mild systemic disease. After reviewing the risks                            and benefits, the patient was deemed in                            satisfactory condition to undergo the procedure.  After obtaining informed consent, the colonoscope                            was passed under direct vision. Throughout the                            procedure, the patient's blood pressure, pulse, and                            oxygen saturations were monitored continuously. The                            Model CF-HQ190L [id number]) scope was introduced                            through the anus and advanced to the the  cecum,                            identified by appendiceal orifice and ileocecal                            valve. The colonoscopy was performed without                            difficulty. The patient tolerated the procedure                            well. The quality of the bowel preparation was                            good. The ileocecal valve, appendiceal orifice, and                            rectum were photographed. Scope In: 9:51:52 AM Scope Out: 10:15:38 AM Scope Withdrawal Time: 0 hours 14 minutes 9 seconds  Total Procedure Duration: 0 hours 23 minutes 46 seconds  Findings:                 The perianal and digital rectal examinations were                            normal.                           Two sessile polyps were found in the rectum and                            transverse colon. The polyps were 9 to 12 mm in                            size. These polyps were removed with a hot snare.                            Resection and retrieval were complete.                           ~  72mm area of nonbleeding ulcerated mucosa with                            stigmata of recent bleeding were present in the                            ascending colon. Biopsies were taken with a cold                            forceps for histology.                           Multiple small and large-mouthed diverticula were                            found in the sigmoid colon, descending colon and                            ascending colon.                           Non-bleeding internal hemorrhoids were found during                            retroflexion. The hemorrhoids were small. Complications:            No immediate complications. Estimated Blood Loss:     Estimated blood loss was minimal. Impression:               - Two 9 to 12 mm polyps in the rectum and in the                            transverse colon, removed with a hot snare.                            Resected and  retrieved.                           - Mucosal ulceration. Biopsied.                           - Diverticulosis in the sigmoid colon, in the                            descending colon and in the ascending colon.                           - Non-bleeding internal hemorrhoids. Recommendation:           - Patient has a contact number available for                            emergencies. The signs and symptoms of potential                            delayed complications were discussed with  the                            patient. Return to normal activities tomorrow.                            Written discharge instructions were provided to the                            patient.                           - Resume previous diet.                           - Continue present medications.                           - Await pathology results.                           - Repeat colonoscopy in 3 years for surveillance.                           - Return to GI clinic PRN. Mauri Pole, MD 02/29/2016 10:21:59 AM This report has been signed electronically.

## 2016-02-29 NOTE — Progress Notes (Signed)
approx 1000 tech was giving abd pressure and dr still going in to cecum  Pt noticed to be wretching.  Dr informed, abd pressure released, head dropped to steep t burg, and pt suctioned.  Less than 50 cc suctioned clear fluid.

## 2016-02-29 NOTE — Progress Notes (Signed)
Called to room to assist during endoscopic procedure.  Patient ID and intended procedure confirmed with present staff. Received instructions for my participation in the procedure from the performing physician.  

## 2016-03-01 ENCOUNTER — Telehealth: Payer: Self-pay | Admitting: *Deleted

## 2016-03-01 NOTE — Telephone Encounter (Signed)
No answer. Number identifier. Message left to call if questions or concerns. 

## 2016-03-09 ENCOUNTER — Encounter: Payer: Self-pay | Admitting: Gastroenterology

## 2016-08-15 ENCOUNTER — Other Ambulatory Visit: Payer: Self-pay | Admitting: Internal Medicine

## 2016-08-15 DIAGNOSIS — Z1231 Encounter for screening mammogram for malignant neoplasm of breast: Secondary | ICD-10-CM

## 2016-09-12 ENCOUNTER — Encounter: Payer: Self-pay | Admitting: Podiatry

## 2016-09-12 ENCOUNTER — Ambulatory Visit (INDEPENDENT_AMBULATORY_CARE_PROVIDER_SITE_OTHER): Payer: 59 | Admitting: Podiatry

## 2016-09-12 VITALS — BP 144/75 | HR 77 | Resp 18

## 2016-09-12 DIAGNOSIS — L608 Other nail disorders: Secondary | ICD-10-CM | POA: Diagnosis not present

## 2016-09-12 DIAGNOSIS — E119 Type 2 diabetes mellitus without complications: Secondary | ICD-10-CM | POA: Diagnosis not present

## 2016-09-12 DIAGNOSIS — L84 Corns and callosities: Secondary | ICD-10-CM

## 2016-09-12 NOTE — Progress Notes (Signed)
Patient ID: Holly Osborn, female   DOB: 1945-10-23, 70 y.o.   MRN: [id number]   Subjective: This patient presents today requesting a yearly diabetic foot examination as well as trimming her toenails and calluses. She is a known diabetic with A1c's per patient in the 8 range. She denies any history of foot ulceration, claudication or amputation. Patient was last evaluated on 09/13/2015  Objective:  Orientated 3  Vascular: DP and PT pulses 2/4 bilaterally Capillary reflex immediate bilaterally  Neurological: Sensation to 10 g monofilament wire intact 5/5 bilaterally Vibratory sensation reactive bilaterally Ankle reflex equal and reactive bilaterally  Dermatological: No open skin lesions bilaterally The toenails are elongated and incurvated with mild hypertrophy in the fifth toes bilaterally, 6-10 Plantar keratoses third MPJ bilaterally Atrophic plantar MPJ fat-pad bilaterally Minimal corn lateral fifth right toe  Musculoskeletal: Hammertoe deformities 2-4 bilaterally There is no crepitus or pain upon range of motion of ankle, subtalar, midtarsal joints bilaterally Manual motor testing dorsi flexion, plantar flexion, inversion, eversion 5/5 bilaterally  Assessment: Diabetic without foot complications Intact neurovascular status Incurvated toenails 6-10 Plantar calluses 2  Plan: Today I reviewed the results of exam with patient today. I suggested she might want to use a pumice stone after showering on the plantar calluses The toenails 6-10 were debrided mechanically and electronically without any bleeding Plantar calluses 2 debrided without any bleeding  Reappoint yearly or at patient's request

## 2016-09-12 NOTE — Patient Instructions (Signed)

## 2016-09-12 NOTE — Progress Notes (Signed)
   Subjective:    Patient ID: Holly Osborn, female    DOB: 1945-12-03, 70 y.o.   MRN: [id number]  HPI   I am here to get my feet checked and my toenails trimmed     Review of Systems  HENT:       Cold   All other systems reviewed and are negative.      Objective:   Physical Exam        Assessment & Plan:

## 2016-09-21 ENCOUNTER — Ambulatory Visit
Admission: RE | Admit: 2016-09-21 | Discharge: 2016-09-21 | Disposition: A | Discharge status: Home / Self Care | Payer: Medicare Other | Source: Ambulatory Visit | Attending: Internal Medicine | Admitting: Internal Medicine

## 2016-09-21 DIAGNOSIS — Z1231 Encounter for screening mammogram for malignant neoplasm of breast: Secondary | ICD-10-CM

## 2017-09-05 ENCOUNTER — Other Ambulatory Visit: Payer: Self-pay | Admitting: Internal Medicine

## 2017-09-05 DIAGNOSIS — Z1231 Encounter for screening mammogram for malignant neoplasm of breast: Secondary | ICD-10-CM

## 2017-09-10 ENCOUNTER — Other Ambulatory Visit: Payer: Self-pay | Admitting: Obstetrics and Gynecology

## 2017-09-10 ENCOUNTER — Ambulatory Visit: Payer: 59 | Admitting: Podiatry

## 2017-09-10 DIAGNOSIS — N644 Mastodynia: Secondary | ICD-10-CM

## 2017-09-11 ENCOUNTER — Ambulatory Visit: Payer: 59 | Admitting: Podiatry

## 2017-09-23 ENCOUNTER — Other Ambulatory Visit: Payer: Medicare Other

## 2017-09-30 ENCOUNTER — Ambulatory Visit
Admission: RE | Admit: 2017-09-30 | Discharge: 2017-09-30 | Disposition: A | Discharge status: Home / Self Care | Payer: Medicare Other | Source: Ambulatory Visit | Attending: Obstetrics and Gynecology | Admitting: Obstetrics and Gynecology

## 2017-09-30 DIAGNOSIS — N644 Mastodynia: Secondary | ICD-10-CM

## 2017-09-30 HISTORY — DX: Personal history of irradiation: Z92.3

## 2017-10-04 ENCOUNTER — Emergency Department (HOSPITAL_BASED_OUTPATIENT_CLINIC_OR_DEPARTMENT_OTHER)
Admission: EM | Admit: 2017-10-04 | Discharge: 2017-10-04 | Disposition: A | Discharge status: Home / Self Care | Payer: Medicare Other | Attending: Emergency Medicine | Admitting: Emergency Medicine

## 2017-10-04 ENCOUNTER — Emergency Department (HOSPITAL_BASED_OUTPATIENT_CLINIC_OR_DEPARTMENT_OTHER): Payer: Medicare Other

## 2017-10-04 ENCOUNTER — Encounter (HOSPITAL_BASED_OUTPATIENT_CLINIC_OR_DEPARTMENT_OTHER): Payer: Self-pay | Admitting: *Deleted

## 2017-10-04 ENCOUNTER — Other Ambulatory Visit: Payer: Self-pay

## 2017-10-04 DIAGNOSIS — Y92000 Kitchen of unspecified non-institutional (private) residence as  the place of occurrence of the external cause: Secondary | ICD-10-CM | POA: Diagnosis not present

## 2017-10-04 DIAGNOSIS — Z853 Personal history of malignant neoplasm of breast: Secondary | ICD-10-CM | POA: Insufficient documentation

## 2017-10-04 DIAGNOSIS — Y999 Unspecified external cause status: Secondary | ICD-10-CM | POA: Insufficient documentation

## 2017-10-04 DIAGNOSIS — Z7902 Long term (current) use of antithrombotics/antiplatelets: Secondary | ICD-10-CM | POA: Insufficient documentation

## 2017-10-04 DIAGNOSIS — I10 Essential (primary) hypertension: Secondary | ICD-10-CM | POA: Diagnosis not present

## 2017-10-04 DIAGNOSIS — Z7982 Long term (current) use of aspirin: Secondary | ICD-10-CM | POA: Diagnosis not present

## 2017-10-04 DIAGNOSIS — S61211A Laceration without foreign body of left index finger without damage to nail, initial encounter: Secondary | ICD-10-CM | POA: Diagnosis not present

## 2017-10-04 DIAGNOSIS — Y939 Activity, unspecified: Secondary | ICD-10-CM | POA: Diagnosis not present

## 2017-10-04 DIAGNOSIS — S6992XA Unspecified injury of left wrist, hand and finger(s), initial encounter: Secondary | ICD-10-CM | POA: Diagnosis present

## 2017-10-04 DIAGNOSIS — Z87891 Personal history of nicotine dependence: Secondary | ICD-10-CM | POA: Insufficient documentation

## 2017-10-04 DIAGNOSIS — Z794 Long term (current) use of insulin: Secondary | ICD-10-CM | POA: Diagnosis not present

## 2017-10-04 DIAGNOSIS — Z79899 Other long term (current) drug therapy: Secondary | ICD-10-CM | POA: Insufficient documentation

## 2017-10-04 DIAGNOSIS — W260XXA Contact with knife, initial encounter: Secondary | ICD-10-CM | POA: Diagnosis not present

## 2017-10-04 DIAGNOSIS — E119 Type 2 diabetes mellitus without complications: Secondary | ICD-10-CM | POA: Insufficient documentation

## 2017-10-04 MED ORDER — SULFAMETHOXAZOLE-TRIMETHOPRIM 800-160 MG PO TABS: 1.0000 | ORAL_TABLET | Freq: Two times a day (BID) | ORAL | 0 refills | Status: AC

## 2017-10-04 MED FILL — SULFAMETHOXAZOLE/TMP DS TAB: 800-160 | 5 days supply | Qty: 10 | Fill #0

## 2017-10-04 NOTE — Discharge Instructions (Signed)
Please keep antibiotic ointment on the cut.  Use the splint to help keep the finger immobile so that it will heal.  Keep the wound clean and dry.  Take the antibiotic to prevent infection.  Follow-up in 1 week with your primary care doctor if symptoms not improving.  Return the ED with any worsening symptoms.

## 2017-10-04 NOTE — ED Provider Notes (Signed)
[facility] Provider Note   CSN: [id number] Arrival date & time: 10/04/17  1259     History   Chief Complaint Chief Complaint  Patient presents with  . Laceration    HPI Holly Osborn is a 72 y.o. female.  HPI 72 year old AA female past medical history significant for hypertension and diabetes presents to the emergency department today with laceration to her left index finger.  Patient states that as she was using the knife 4 days ago while in the kitchen and actually sliced her left index finger.  She states that this happened 4 days ago.  States that she has noticed some redness around the area and that the wound keeps coming open when she bends her finger.  Patient denies any associated drainage.  Denies any associated fever, chills, nausea, vomiting.  Patient is diabetic and is concerned for infection.  She has no pain over the laceration.  States that her tetanus shot was 3 years ago.  No associated paresthesias or weakness. Past Medical History:  Diagnosis Date  . Cancer [facility]) 2003   right breast  . Diabetes mellitus without complication ([place])   . Hypertension   . Personal history of radiation therapy   . Seasonal allergies     There are no active problems to display for this patient.   Past Surgical History:  Procedure Laterality Date  . BREAST LUMPECTOMY  2003   right  . SALPINGOOPHORECTOMY    . WISDOM TOOTH EXTRACTION      OB History    No data available       Home Medications    Prior to Admission medications   Medication Sig Start Date End Date Taking? Authorizing Provider  ALPRAZolam Duanne Moron) 0.5 MG tablet Take 0.5 mg by mouth daily as needed. 08/01/15   [provider]  aspirin 81 MG tablet Take 81 mg by mouth daily.    [provider]  B-D INS SYR ULTRAFINE 1CC/31G 31G X 5/16" 1 ML MISC USE AS DIRECTED 3 TIMES A DAY 07/28/15   [provider]  Biotin 1000 MCG tablet Take 1,000 mcg by mouth  3 (three) times daily.    [provider]  buPROPion (WELLBUTRIN XL) 300 MG 24 hr tablet Take 300 mg by mouth daily.    [provider]  cetirizine (ZYRTEC) 10 MG tablet Take 10 mg by mouth daily.    [provider]  cyclobenzaprine (FLEXERIL) 10 MG tablet Take 10 mg by mouth 3 (three) times daily as needed for muscle spasms.    [provider]  diltiazem (TIAZAC) 300 MG 24 hr capsule Take 300 mg by mouth daily. 08/19/15   [provider]  Ginkgo Biloba 40 MG TABS Take by mouth. Reported on 02/29/2016    [provider]  INVOKANA 300 MG TABS tablet  09/12/15   [provider]  losartan-hydrochlorothiazide (HYZAAR) 100-12.5 MG tablet Take 1 tablet by mouth daily. 06/24/15   [provider]  Multiple Vitamin (MULTIVITAMIN) tablet Take 1 tablet by mouth daily. Centrum Silver 1 daily for Women >50    [provider]  NOVOLOG MIX 70/30 (70-30) 100 UNIT/ML injection inject SQ 50-50-50 morning noon and night 07/28/15   [provider]  Omega-3 Fatty Acids (FISH OIL) 1000 MG CAPS Take by mouth.    [provider]  OVER THE COUNTER MEDICATION Horsetail 440 capsule 4-6 tabs daily    [provider]  OVER THE COUNTER MEDICATION Vitamin  B with folic acid and vitamin c    [provider]  pravastatin (PRAVACHOL) 20 MG tablet Take 20 mg by mouth daily.    [provider]  sulfamethoxazole-trimethoprim (BACTRIM DS,SEPTRA DS) 800-160 MG tablet Take 1 tablet by mouth 2 (two) times daily for 5 days. 10/04/17 10/09/17  Doristine Devoid, PA-C  tiZANidine (ZANAFLEX) 4 MG capsule Take 4 mg by mouth 3 (three) times daily.    [provider]  traMADol (ULTRAM) 50 MG tablet Take by mouth every 8 (eight) hours as needed.    [provider]  Vitamin D, Ergocalciferol, (DRISDOL) 50000 UNITS CAPS capsule TAKE ONE CAPSULE TWICE WEEKLY 08/28/15   [provider]    Family  History Family History  Problem Relation Age of Onset  . Colon cancer Brother        x2 brothers  . Prostate cancer Father   . Throat cancer Brother   . Lung cancer Sister     Social History Social History   Tobacco Use  . Smoking status: Former Smoker    Last attempt to quit: 12/18/2001    Years since quitting: 15.8  . Smokeless tobacco: Never Used  . Tobacco comment: quit 2003  Substance Use Topics  . Alcohol use: Yes    Alcohol/week: 0.0 oz    Comment: once monthly  . Drug use: No     Allergies   Penicillins   Review of Systems Review of Systems  Constitutional: Negative for chills and fever.  Gastrointestinal: Negative for nausea and vomiting.  Musculoskeletal: Negative for myalgias.  Skin: Positive for wound.  Neurological: Negative for weakness and numbness.     Physical Exam Updated Vital Signs BP 129/81 (BP Location: Right Arm)   Pulse 72   Temp 98.3 F (36.8 C) (Oral)   Resp 18   Ht 5\' 9"  (1.753 m)   Wt 90.7 kg (200 lb)   SpO2 100%   BMI 29.53 kg/m   Physical Exam  Constitutional: She appears well-developed and well-nourished. No distress.  HENT:  Head: Normocephalic and atraumatic.  Eyes: Right eye exhibits no discharge. Left eye exhibits no discharge. No scleral icterus.  Neck: Normal range of motion.  Pulmonary/Chest: No respiratory distress.  Musculoskeletal: Normal range of motion.  Patient has full range of motion of the DIP, PIP, MCP joint of the left hand.  Good grip strength.  Brisk cap refill.  Sensation intact.  Neurological: She is alert.  Skin: Skin is warm and dry. Capillary refill takes less than 2 seconds. No pallor.  The patient has 1 cm laceration at the base of the left index finger on the radial aspect of the finger.  This arteries show signs of secondary closure with a granulated tissue.  There is minimal erythema around the area.  There is no gaping wound.  No purulent drainage.  No area of fluctuance or induration.     Psychiatric: Her behavior is normal. Judgment and thought content normal.  Nursing note and vitals reviewed.    ED Treatments / Results  Labs (all labs ordered are listed, but only abnormal results are displayed) Labs Reviewed - No data to display  EKG  EKG Interpretation None       Radiology Dg Hand Complete Left  Result Date: 10/04/2017 CLINICAL DATA:  Patient cut her hand 4 days ago and is not healing at the second PIP joint. EXAM: LEFT HAND - COMPLETE 3+ VIEW COMPARISON:  None. FINDINGS: There is no evidence of  fracture or dislocation. No bone destruction is identified. The reported laceration of the left index finger is difficult to identify radiographically. No radiopaque foreign body. There is no evidence of arthropathy or other focal bone abnormality. Soft tissues are unremarkable. IMPRESSION: No acute osseous abnormality nor bone destruction of the left hand and wrist. No radiopaque foreign body. Electronically Signed   By: Ashley Royalty M.D.   On: 10/04/2017 14:25    Procedures Procedures (including critical care time) SPLINT APPLICATION Date/Time: 8:25 PM Authorized by: Ocie Cornfield Consent: Verbal consent obtained. Risks and benefits: risks, benefits and alternatives were discussed Consent given by: patient Splint applied by: orthopedic technician Location details: Left index finger Splint type: Static finger splint Supplies used: Metal finger splint Post-procedure: The splinted body part was neurovascularly unchanged following the procedure. Patient tolerance: Patient tolerated the procedure well with no immediate complications.    Medications Ordered in ED Medications - No data to display   Initial Impression / Assessment and Plan / ED Course  I have reviewed the triage vital signs and the nursing notes.  Pertinent labs & imaging results that were available during my care of the patient were reviewed by me and considered in my medical decision making  (see chart for details).     Patient has a 76-day-old laceration to the base of the left index finger.  Patient is neurovascularly intact.  Patient is diabetic and was concerned that she may be developing an infection.  X-ray obtained of the hand shows no acute abnormalities.  There is no gaping wound noted.  No signs of purulent drainage.  Minimal erythema around the cut was noted.  Discussed with patient given her history of diabetes will start patient on prophylactic antibiotics including Bactrim as patient has a penicillin allergy.  She will be placed in a static finger splint to help with protection of the area and the area was cleaned and dressed in the ED with antibiotic ointment.  Encourage symptomatic treatment at home.  And follow-up in 2-3 days with primary care doctor for repeat recheck of the wound.  Pt is hemodynamically stable, in NAD, & able to ambulate in the ED. Evaluation does not show pathology that would require ongoing emergent intervention or inpatient treatment. I explained the diagnosis to the patient. Pain has been managed & has no complaints prior to dc. Pt is comfortable with above plan and is stable for discharge at this time. All questions were answered prior to disposition. Strict return precautions for f/u to the ED were discussed. Encouraged follow up with PCP.   Final Clinical Impressions(s) / ED Diagnoses   Final diagnoses:  Laceration of left index finger without foreign body without damage to nail, initial encounter    ED Discharge Orders        Ordered    sulfamethoxazole-trimethoprim (BACTRIM DS,SEPTRA DS) 800-160 MG tablet  2 times daily     10/04/17 1532       Aaron Edelman 10/04/17 1653    Margette Fast, MD 10/04/17 2004

## 2017-10-04 NOTE — ED Triage Notes (Signed)
Pt c/o lac to left index finger by kitchen knife x 4 days ago

## 2017-10-15 ENCOUNTER — Ambulatory Visit: Payer: 59 | Admitting: Podiatry

## 2017-10-22 ENCOUNTER — Ambulatory Visit: Payer: 59 | Admitting: Podiatry

## 2018-06-05 ENCOUNTER — Other Ambulatory Visit: Payer: Self-pay | Admitting: Internal Medicine

## 2018-06-05 DIAGNOSIS — N183 Chronic kidney disease, stage 3 unspecified: Secondary | ICD-10-CM

## 2018-06-09 ENCOUNTER — Ambulatory Visit
Admission: RE | Admit: 2018-06-09 | Discharge: 2018-06-09 | Disposition: A | Discharge status: Home / Self Care | Payer: Medicare Other | Source: Ambulatory Visit | Attending: Internal Medicine | Admitting: Internal Medicine

## 2018-06-09 DIAGNOSIS — N183 Chronic kidney disease, stage 3 unspecified: Secondary | ICD-10-CM

## 2018-09-02 ENCOUNTER — Other Ambulatory Visit: Payer: Self-pay | Admitting: Obstetrics and Gynecology

## 2018-09-02 DIAGNOSIS — Z1231 Encounter for screening mammogram for malignant neoplasm of breast: Secondary | ICD-10-CM

## 2018-10-08 ENCOUNTER — Ambulatory Visit: Payer: Medicare Other

## 2018-11-06 ENCOUNTER — Ambulatory Visit: Payer: Medicare Other

## 2018-12-03 ENCOUNTER — Ambulatory Visit
Admission: RE | Admit: 2018-12-03 | Discharge: 2018-12-03 | Disposition: A | Discharge status: Home / Self Care | Payer: Medicare Other | Source: Ambulatory Visit | Attending: Obstetrics and Gynecology | Admitting: Obstetrics and Gynecology

## 2018-12-03 DIAGNOSIS — Z1231 Encounter for screening mammogram for malignant neoplasm of breast: Secondary | ICD-10-CM

## 2019-02-11 ENCOUNTER — Encounter: Payer: Self-pay | Admitting: Gastroenterology

## 2019-03-17 ENCOUNTER — Encounter: Payer: Self-pay | Admitting: *Deleted

## 2019-03-17 ENCOUNTER — Telehealth: Payer: Self-pay | Admitting: Gastroenterology

## 2019-03-17 NOTE — Telephone Encounter (Signed)
Patient returned your call.

## 2019-03-18 ENCOUNTER — Encounter: Payer: Self-pay | Admitting: Gastroenterology

## 2019-03-18 ENCOUNTER — Ambulatory Visit (INDEPENDENT_AMBULATORY_CARE_PROVIDER_SITE_OTHER): Payer: Medicare Other | Admitting: Gastroenterology

## 2019-03-18 ENCOUNTER — Other Ambulatory Visit: Payer: Self-pay

## 2019-03-18 VITALS — Ht 70.0 in | Wt 225.0 lb

## 2019-03-18 DIAGNOSIS — Z86010 Personal history of colonic polyps: Secondary | ICD-10-CM | POA: Diagnosis not present

## 2019-03-18 DIAGNOSIS — Z8719 Personal history of other diseases of the digestive system: Secondary | ICD-10-CM | POA: Diagnosis not present

## 2019-03-18 DIAGNOSIS — K5790 Diverticulosis of intestine, part unspecified, without perforation or abscess without bleeding: Secondary | ICD-10-CM

## 2019-03-18 NOTE — Patient Instructions (Addendum)
   You have been scheduled for a colonoscopy. Please follow written instructions given to you at your visit today.  Please pick up your prep supplies at the pharmacy within the next 1-3 days. If you use inhalers (even only as needed), please bring them with you on the day of your procedure.   I appreciate the  opportunity to care for you  Thank You   Harl Bowie , MD

## 2019-03-18 NOTE — Progress Notes (Signed)
Holly Osborn    [id number]    18-Jul-1946  Primary Care Physician:Avva, Steva Ready, MD  Referring Physician: Prince Solian, [address] [place],  [place] [address]  This service was provided via audio only telemedicine (telephone) due to [place] 19 pandemic.  Patient location: Home Provider location: Office Used 2 patient identifiers to confirm the correct person. Explained the limitations in evaluation and management via telemedicine. Patient is aware of potential medical charges for this visit.  Patient consented to this virtual visit.  The persons participating in this telemedicine service were myself and the patient  Time spent: 15 minutes  Chief complaint: diverticulitis  HPI: 73 year old female here for follow-up visit after recent episode of acute diverticulitis.  She was treated with 10-day course of antibiotics with improvement of symptoms.  No longer has abdominal pain.  Denies any fever, nausea, vomiting, mucus or blood per rectum.  Colonoscopy Feb 29, 2016: Removed 2 polyps [tubular adenoma] 9 to 12 mm in size, diverticulosis and hemorrhoids   Outpatient Encounter Medications as of 03/18/2019  Medication Sig  . ALPRAZolam (XANAX) 0.5 MG tablet Take 0.5 mg by mouth daily as needed.  Marland Kitchen aspirin 81 MG tablet Take 81 mg by mouth daily.  . B-D INS SYR ULTRAFINE 1CC/31G 31G X 5/16" 1 ML MISC 29 gage  . Biotin 1000 MCG tablet Take 1,000 mcg by mouth 3 (three) times daily.  Marland Kitchen buPROPion (WELLBUTRIN XL) 300 MG 24 hr tablet Take 300 mg by mouth daily.  . cetirizine (ZYRTEC) 10 MG tablet Take 10 mg by mouth daily.  Marland Kitchen diltiazem (CARDIZEM CD) 300 MG 24 hr capsule Take 300 mg by mouth daily.  . empagliflozin (JARDIANCE) 25 MG TABS tablet Take 25 mg by mouth daily.  . Insulin Lispro Prot & Lispro (HUMALOG MIX 75/25 Imperial) Inject into the skin. 60umits in the am  60 units in the pm  . losartan-hydrochlorothiazide (HYZAAR) 100-12.5 MG tablet Take 1 tablet by mouth  daily.  . Multiple Vitamin (MULTIVITAMIN) tablet Take 1 tablet by mouth daily. Centrum Silver 1 daily for Women >50  . OVER THE COUNTER MEDICATION Horsetail 440 capsule 4-6 tabs daily  . OVER THE COUNTER MEDICATION Vitamin B with folic acid and vitamin c  . Probiotic Product (PROBIOTIC ADVANCED PO) Take by mouth.  . telmisartan-hydrochlorothiazide (MICARDIS HCT) 80-12.5 MG tablet Take 1 tablet by mouth daily.  . Turmeric (QC TUMERIC COMPLEX PO) Take by mouth.  . Vitamin D, Ergocalciferol, (DRISDOL) 50000 UNITS CAPS capsule TAKE ONE CAPSULE TWICE WEEKLY   No facility-administered encounter medications on file as of 03/18/2019.     Allergies as of 03/18/2019 - Review Complete 03/18/2019  Allergen Reaction Noted  . Penicillins Rash 02/29/2016    Past Medical History:  Diagnosis Date  . 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 Diabetes mellitus without complication ([place])   . Hypertension   . Personal history of radiation therapy   . Seasonal allergies     Past Surgical History:  Procedure Laterality Date  . BREAST LUMPECTOMY Right 2003  . SALPINGOOPHORECTOMY    . WISDOM TOOTH EXTRACTION      Family History  Problem Relation Age of Onset  . Colon cancer Brother        x2 brothers  . Prostate cancer Father   . Throat cancer Brother   . Lung cancer Sister     Social History   Socioeconomic History  . Marital status:  Divorced    Spouse name: Not on file  . Number of children: Not on file  . Years of education: Not on file  . Highest education level: Not on file  Occupational History  . Not on file  Social Needs  . Financial resource strain: Not on file  . Food insecurity    Worry: Not on file    Inability: Not on file  . Transportation needs    Medical: Not on file    Non-medical: Not on file  Tobacco Use  . Smoking status: Former Smoker    Quit date: 12/18/2001    Years since quitting: 17.2  . Smokeless tobacco: Never Used  . Tobacco comment: quit 2003  Substance  and Sexual Activity  . Alcohol use: Yes    Alcohol/week: 0.0 standard drinks    Comment: once monthly  . Drug use: No  . Sexual activity: Not on file  Lifestyle  . Physical activity    Days per week: Not on file    Minutes per session: Not on file  . Stress: Not on file  Relationships  . Social Herbalist on phone: Not on file    Gets together: Not on file    Attends religious service: Not on file    Active member of club or organization: Not on file    Attends meetings of clubs or organizations: Not on file    Relationship status: Not on file  . Intimate partner violence    Fear of current or ex partner: Not on file    Emotionally abused: Not on file    Physically abused: Not on file    Forced sexual activity: Not on file  Other Topics Concern  . Not on file  Social History Narrative  . Not on file      Review of systems: Review of Systems as per HPI All other systems reviewed and are negative.   Physical Exam: Vitals were not taken and physical exam was not performed during this virtual visit.  Data Reviewed:  Reviewed labs, radiology imaging, old records and pertinent past GI work up   Assessment and Plan/Recommendations:  73 year old female with history of pancolonic diverticulosis and large adenomatous polyps removed in 2017. Recent episode of diverticulitis, completed antibiotics last week Due for surveillance colonoscopy, will schedule it in 4 to 6 weeks The risks and benefits as well as alternatives of endoscopic procedure(s) have been discussed and reviewed. All questions answered. The patient agrees to proceed.  Continue high-fiber diet with whole grains and vegetables Increase water intake to 8 to 10 cups daily     K. Denzil Magnuson , MD   CC: Prince Solian, MD

## 2019-03-19 ENCOUNTER — Telehealth: Payer: Self-pay | Admitting: *Deleted

## 2019-03-19 DIAGNOSIS — K625 Hemorrhage of anus and rectum: Secondary | ICD-10-CM

## 2019-03-19 DIAGNOSIS — R109 Unspecified abdominal pain: Secondary | ICD-10-CM

## 2019-03-19 DIAGNOSIS — Z8719 Personal history of other diseases of the digestive system: Secondary | ICD-10-CM

## 2019-03-19 MED ORDER — SUPREP BOWEL PREP KIT 17.5-3.13-1.6 GM/177ML PO SOLN: ORAL | 0 refills | Status: DC

## 2019-03-19 NOTE — Telephone Encounter (Signed)
Patient scheduled colon Holly Osborn 04/29/2019 at 10:30am  Will mail instructions to patient today

## 2019-04-28 ENCOUNTER — Telehealth: Payer: Self-pay | Admitting: Gastroenterology

## 2019-04-28 NOTE — Telephone Encounter (Signed)
Spoke with patient regarding Covid-19 screening questions. Covid-19 Screening Questions:  Do you now or have you had a fever in the last 14 days? no  Do you have any respiratory symptoms of shortness of breath or cough now or in the last 14 days? no  Do you have any family members or close contacts with diagnosed or suspected Covid-19 in the past 14 days? no  Have you been tested for Covid-19 and found to be positive? Yes 3 weeks ago and she has not received results  Pt made aware of that care partner may wait in the car or come up to the lobby during the procedure but will need to provide their own mask.

## 2019-04-29 ENCOUNTER — Encounter: Payer: Self-pay | Admitting: Gastroenterology

## 2019-04-29 ENCOUNTER — Other Ambulatory Visit: Payer: Self-pay

## 2019-04-29 ENCOUNTER — Ambulatory Visit (AMBULATORY_SURGERY_CENTER): Payer: Medicare Other | Admitting: Gastroenterology

## 2019-04-29 VITALS — BP 155/71 | HR 60 | Temp 98.6°F | Resp 14 | Ht 70.0 in | Wt 225.0 lb

## 2019-04-29 DIAGNOSIS — K635 Polyp of colon: Secondary | ICD-10-CM | POA: Diagnosis not present

## 2019-04-29 DIAGNOSIS — Z86010 Personal history of colonic polyps: Secondary | ICD-10-CM

## 2019-04-29 DIAGNOSIS — D125 Benign neoplasm of sigmoid colon: Secondary | ICD-10-CM

## 2019-04-29 DIAGNOSIS — K621 Rectal polyp: Secondary | ICD-10-CM

## 2019-04-29 DIAGNOSIS — D128 Benign neoplasm of rectum: Secondary | ICD-10-CM

## 2019-04-29 MED ORDER — SODIUM CHLORIDE 0.9 % IV SOLN: 500.0000 mL | Freq: Once | INTRAVENOUS | Status: DC

## 2019-04-29 NOTE — Op Note (Signed)
[place] Patient Name: Holly Osborn Procedure Date: 04/29/2019 9:36 AM MRN: [id number] Endoscopist: Mauri Pole , MD Age: 73 Referring MD:  Date of Birth: 02/09/1946 Gender: Female Account #: [id number] Procedure:                Colonoscopy Indications:              High risk colon cancer surveillance: Personal                            history of adenoma (10 mm or greater in size) Medicines:                Monitored Anesthesia Care Procedure:                Pre-Anesthesia Assessment:                           - Prior to the procedure, a History and Physical                            was performed, and patient medications and                            allergies were reviewed. The patient's tolerance of                            previous anesthesia was also reviewed. The risks                            and benefits of the procedure and the sedation                            options and risks were discussed with the patient.                            All questions were answered, and informed consent                            was obtained. Prior Anticoagulants: The patient has                            taken no previous anticoagulant or antiplatelet                            agents. ASA Grade Assessment: III - A patient with                            severe systemic disease. After reviewing the risks                            and benefits, the patient was deemed in                            satisfactory condition to undergo the procedure.  After obtaining informed consent, the colonoscope                            was passed under direct vision. Throughout the                            procedure, the patient's blood pressure, pulse, and                            oxygen saturations were monitored continuously. The                            Colonoscope was introduced through the anus and                            advanced to  the the cecum, identified by                            appendiceal orifice and ileocecal valve. The                            colonoscopy was performed without difficulty. The                            patient tolerated the procedure well. The quality                            of the bowel preparation was good. The ileocecal                            valve, appendiceal orifice, and rectum were                            photographed. Scope In: 10:03:05 AM Scope Out: 10:29:05 AM Scope Withdrawal Time: 0 hours 18 minutes 24 seconds  Total Procedure Duration: 0 hours 26 minutes 0 seconds  Findings:                 The perianal and digital rectal examinations were                            normal.                           Two sessile polyps were found in the rectum and                            sigmoid colon. The polyps were 3 to 5 mm in size.                            These polyps were removed with a cold snare.                            Resection and retrieval were complete.  A less than 1 mm polyp was found in the rectum. The                            polyp was sessile. The polyp was removed with a                            cold biopsy forceps. Resection and retrieval were                            complete.                           Scattered small and large-mouthed diverticula were                            found in the sigmoid colon, descending colon,                            transverse colon, ascending colon and cecum. There                            was narrowing of the colon in association with the                            diverticular opening. There was evidence of                            diverticular spasm. Peri-diverticular erythema was                            seen. There was evidence of an impacted                            diverticulum.                           Non-bleeding internal hemorrhoids were found during                             retroflexion. The hemorrhoids were small. Complications:            No immediate complications. Estimated Blood Loss:     Estimated blood loss was minimal. Impression:               - Two 3 to 5 mm polyps in the rectum and in the                            sigmoid colon, removed with a cold snare. Resected                            and retrieved.                           - One less than 1 mm polyp in the rectum, removed  with a cold biopsy forceps. Resected and retrieved.                           - Severe diverticulosis in the sigmoid colon, in                            the descending colon, in the transverse colon, in                            the ascending colon and in the cecum. There was                            narrowing of the colon in association with the                            diverticular opening. There was evidence of                            diverticular spasm. Peri-diverticular erythema was                            seen. There was evidence of an impacted                            diverticulum.                           - Non-bleeding internal hemorrhoids. Recommendation:           - Patient has a contact number available for                            emergencies. The signs and symptoms of potential                            delayed complications were discussed with the                            patient. Return to normal activities tomorrow.                            Written discharge instructions were provided to the                            patient.                           - Resume previous diet.                           - Continue present medications.                           - Await pathology results.                           -  Repeat colonoscopy in 3 - 5 years for                            surveillance based on pathology results. Mauri Pole, MD 04/29/2019 10:37:52 AM This report has been signed  electronically.

## 2019-04-29 NOTE — Progress Notes (Signed)
Called to room to assist during endoscopic procedure.  Patient ID and intended procedure confirmed with present staff. Received instructions for my participation in the procedure from the performing physician.  

## 2019-04-29 NOTE — Progress Notes (Signed)
PT taken to PACU. Monitors in place. VSS. Report given to RN. 

## 2019-04-29 NOTE — Patient Instructions (Signed)
Handouts provided:  Polyps  YOU HAD AN ENDOSCOPIC PROCEDURE TODAY AT [facility]:   Refer to the procedure report that was given to you for any specific questions about what was found during the examination.  If the procedure report does not answer your questions, please call your gastroenterologist to clarify.  If you requested that your care partner not be given the details of your procedure findings, then the procedure report has been included in a sealed envelope for you to review at your convenience later.  YOU SHOULD EXPECT: Some feelings of bloating in the abdomen. Passage of more gas than usual.  Walking can help get rid of the air that was put into your GI tract during the procedure and reduce the bloating. If you had a lower endoscopy (such as a colonoscopy or flexible sigmoidoscopy) you may notice spotting of blood in your stool or on the toilet paper. If you underwent a bowel prep for your procedure, you may not have a normal bowel movement for a few days.  Please Note:  You might notice some irritation and congestion in your nose or some drainage.  This is from the oxygen used during your procedure.  There is no need for concern and it should clear up in a day or so.  SYMPTOMS TO REPORT IMMEDIATELY:   Following lower endoscopy (colonoscopy or flexible sigmoidoscopy):  Excessive amounts of blood in the stool  Significant tenderness or worsening of abdominal pains  Swelling of the abdomen that is new, acute  Fever of 100F or higher  For urgent or emergent issues, a gastroenterologist can be reached at any hour by calling [phone].   DIET:  We do recommend a small meal at first, but then you may proceed to your regular diet.  Drink plenty of fluids but you should avoid alcoholic beverages for 24 hours.  ACTIVITY:  You should plan to take it easy for the rest of today and you should NOT DRIVE or use heavy machinery until tomorrow (because of the sedation  medicines used during the test).    FOLLOW UP: Our staff will call the number listed on your records 48-72 hours following your procedure to check on you and address any questions or concerns that you may have regarding the information given to you following your procedure. If we do not reach you, we will leave a message.  We will attempt to reach you two times.  During this call, we will ask if you have developed any symptoms of COVID 19. If you develop any symptoms (ie: fever, flu-like symptoms, shortness of breath, cough etc.) before then, please call [phone].  If you test positive for Covid 19 in the 2 weeks post procedure, please call and report this information to [place].    If any biopsies were taken you will be contacted by phone or by letter within the next 1-3 weeks.  Please call us at [phone] if you have not heard about the biopsies in 3 weeks.    SIGNATURES/CONFIDENTIALITY: You and/or your care partner have signed paperwork which will be entered into your electronic medical record.  These signatures attest to the fact that that the information above on your After Visit Summary has been reviewed and is understood.  Full responsibility of the confidentiality of this discharge information lies with you and/or your care-partner.

## 2019-04-29 NOTE — Progress Notes (Signed)
Pt's states no medical or surgical changes since previsit or office visit. 

## 2019-05-01 ENCOUNTER — Telehealth: Payer: Self-pay

## 2019-05-01 NOTE — Telephone Encounter (Signed)
Covid-19 screening questions   Do you now or have you had a fever in the last 14 days? No.  Do you have any respiratory symptoms of shortness of breath or cough now or in the last 14 days? No.  Do you have any family members or close contacts with diagnosed or suspected Covid-19 in the past 14 days? No.  Have you been tested for Covid-19 and found to be positive? No.       Follow up Call-  Call back number 04/29/2019  Post procedure Call Back phone  # [phone]  Permission to leave phone message Yes  Some recent data might be hidden     Patient questions:  Do you have a fever, pain , or abdominal swelling? No. Pain Score  0 *  Have you tolerated food without any problems? Yes.    Have you been able to return to your normal activities? Yes.    Do you have any questions about your discharge instructions: Diet   No. Medications  No. Follow up visit  No.  Do you have questions or concerns about your Care? No.  Actions: * If pain score is 4 or above: No action needed, pain <4.

## 2019-05-12 ENCOUNTER — Encounter: Payer: Self-pay | Admitting: Gastroenterology

## 2019-10-27 ENCOUNTER — Other Ambulatory Visit: Payer: Self-pay | Admitting: Obstetrics and Gynecology

## 2019-10-27 DIAGNOSIS — Z1231 Encounter for screening mammogram for malignant neoplasm of breast: Secondary | ICD-10-CM

## 2019-11-09 ENCOUNTER — Ambulatory Visit: Payer: Medicare PPO | Attending: Internal Medicine

## 2019-11-09 DIAGNOSIS — Z23 Encounter for immunization: Secondary | ICD-10-CM | POA: Insufficient documentation

## 2019-11-09 NOTE — Progress Notes (Signed)
   Covid-19 Vaccination Clinic  Name:  Holly Osborn    MRN: [id number] DOB: 10-Aug-1946  11/09/2019  [name] was observed post Covid-19 immunization for 15 minutes without incidence. She was provided with Vaccine Information Sheet and instruction to access the V-Safe system.   [name] was instructed to call 911 with any severe reactions post vaccine: Marland Kitchen Difficulty breathing  . Swelling of your face and throat  . A fast heartbeat  . A bad rash all over your body  . Dizziness and weakness    Immunizations Administered    Name Date Dose VIS Date Route   Pfizer COVID-19 Vaccine 11/09/2019 10:25 AM 0.3 mL 09/11/2019 Intramuscular   Manufacturer: [place]   Lot: [id number]   [place]: [id number]

## 2019-12-03 ENCOUNTER — Ambulatory Visit: Payer: Medicare PPO | Attending: Internal Medicine

## 2019-12-03 DIAGNOSIS — Z23 Encounter for immunization: Secondary | ICD-10-CM

## 2019-12-03 NOTE — Progress Notes (Signed)
   Covid-19 Vaccination Clinic  Name:  Holly Osborn    MRN: [id number] DOB: July 17, 1946  12/03/2019  [name] was observed post Covid-19 immunization for 15 minutes without incident. She was provided with Vaccine Information Sheet and instruction to access the V-Safe system.   [name] was instructed to call 911 with any severe reactions post vaccine: Marland Kitchen Difficulty breathing  . Swelling of face and throat  . A fast heartbeat  . A bad rash all over body  . Dizziness and weakness   Immunizations Administered    Name Date Dose VIS Date Route   Pfizer COVID-19 Vaccine 12/03/2019  4:49 PM 0.3 mL 09/11/2019 Intramuscular   Manufacturer: [place]   Lot: [id number]   [place]: [id number]

## 2019-12-07 ENCOUNTER — Ambulatory Visit: Payer: Medicare Other

## 2020-01-18 DIAGNOSIS — H1032 Unspecified acute conjunctivitis, left eye: Secondary | ICD-10-CM | POA: Diagnosis not present

## 2020-01-19 ENCOUNTER — Ambulatory Visit
Admission: RE | Admit: 2020-01-19 | Discharge: 2020-01-19 | Disposition: A | Discharge status: Home / Self Care | Payer: Medicare PPO | Source: Ambulatory Visit | Attending: Obstetrics and Gynecology | Admitting: Obstetrics and Gynecology

## 2020-01-19 ENCOUNTER — Other Ambulatory Visit: Payer: Self-pay

## 2020-01-19 DIAGNOSIS — Z1231 Encounter for screening mammogram for malignant neoplasm of breast: Secondary | ICD-10-CM

## 2020-01-27 ENCOUNTER — Other Ambulatory Visit: Payer: Self-pay

## 2020-01-27 ENCOUNTER — Encounter: Payer: Self-pay | Admitting: *Deleted

## 2020-01-27 ENCOUNTER — Encounter: Payer: Self-pay | Admitting: Podiatry

## 2020-01-27 ENCOUNTER — Ambulatory Visit (INDEPENDENT_AMBULATORY_CARE_PROVIDER_SITE_OTHER): Payer: Medicare PPO | Admitting: Podiatry

## 2020-01-27 DIAGNOSIS — B351 Tinea unguium: Secondary | ICD-10-CM

## 2020-01-27 DIAGNOSIS — M79674 Pain in right toe(s): Secondary | ICD-10-CM | POA: Diagnosis not present

## 2020-01-27 DIAGNOSIS — M79675 Pain in left toe(s): Secondary | ICD-10-CM | POA: Diagnosis not present

## 2020-01-27 DIAGNOSIS — Q828 Other specified congenital malformations of skin: Secondary | ICD-10-CM | POA: Diagnosis not present

## 2020-01-27 DIAGNOSIS — E119 Type 2 diabetes mellitus without complications: Secondary | ICD-10-CM

## 2020-01-27 NOTE — Progress Notes (Signed)
This patient returns to my office for at risk foot care.  This patient requires this care by a professional since this patient will be at risk due to having diabetes .  This patient is unable to cut nails herself since the patient cannot reach her nails.These nails are painful walking and wearing shoes.  This patient presents for at risk foot care today.  Patient also has painful forefoot callus both feet.  General Appearance  Alert, conversant and in no acute stress.  Vascular  Dorsalis pedis and posterior tibial  pulses are palpable  bilaterally.  Capillary return is within normal limits  bilaterally. Temperature is within normal limits  bilaterally.  Neurologic  Senn-Weinstein monofilament wire test within normal limits  bilaterally. Muscle power within normal limits bilaterally.  Nails Thick disfigured discolored nails with subungual debris  from hallux to fifth toes bilaterally. No evidence of bacterial infection or drainage bilaterally.  Orthopedic  No limitations of motion  feet .  No crepitus or effusions noted.  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  Skin  normotropic skin bilaterally. Porokeratosis sub 3  B/L. No signs of infections or ulcers noted.     Onychomycosis  Pain in right toes  Pain in left toes  Consent was obtained for treatment procedures.   Mechanical debridement of nails 1-5  bilaterally performed with a nail nipper.  Filed with dremel without incident.  Debride porokeratosis with # 15 blade. Dispense paper avout gel padding.   Return office visit    3 months.                   Told patient to return for periodic foot care and evaluation due to potential at risk complications.   Gardiner Barefoot DPM

## 2020-01-29 DIAGNOSIS — C50919 Malignant neoplasm of unspecified site of unspecified female breast: Secondary | ICD-10-CM | POA: Diagnosis not present

## 2020-02-18 DIAGNOSIS — E1129 Type 2 diabetes mellitus with other diabetic kidney complication: Secondary | ICD-10-CM | POA: Diagnosis not present

## 2020-02-18 DIAGNOSIS — N1832 Chronic kidney disease, stage 3b: Secondary | ICD-10-CM | POA: Diagnosis not present

## 2020-02-18 DIAGNOSIS — I129 Hypertensive chronic kidney disease with stage 1 through stage 4 chronic kidney disease, or unspecified chronic kidney disease: Secondary | ICD-10-CM | POA: Diagnosis not present

## 2020-04-29 ENCOUNTER — Encounter: Payer: Self-pay | Admitting: Podiatry

## 2020-04-29 ENCOUNTER — Other Ambulatory Visit: Payer: Self-pay

## 2020-04-29 ENCOUNTER — Ambulatory Visit (INDEPENDENT_AMBULATORY_CARE_PROVIDER_SITE_OTHER): Payer: Medicare PPO | Admitting: Podiatry

## 2020-04-29 DIAGNOSIS — E119 Type 2 diabetes mellitus without complications: Secondary | ICD-10-CM

## 2020-04-29 DIAGNOSIS — M79674 Pain in right toe(s): Secondary | ICD-10-CM

## 2020-04-29 DIAGNOSIS — B351 Tinea unguium: Secondary | ICD-10-CM

## 2020-04-29 DIAGNOSIS — Q828 Other specified congenital malformations of skin: Secondary | ICD-10-CM | POA: Diagnosis not present

## 2020-04-29 DIAGNOSIS — M79675 Pain in left toe(s): Secondary | ICD-10-CM

## 2020-04-29 NOTE — Progress Notes (Signed)
This patient returns to my office for at risk foot care.  This patient requires this care by a professional since this patient will be at risk due to having diabetes .  This patient is unable to cut nails herself since the patient cannot reach her nails.These nails are painful walking and wearing shoes.  This patient presents for at risk foot care today.  Patient also has painful forefoot callus both feet.  General Appearance  Alert, conversant and in no acute stress.  Vascular  Dorsalis pedis and posterior tibial  pulses are palpable  bilaterally.  Capillary return is within normal limits  bilaterally. Temperature is within normal limits  bilaterally.  Neurologic  Senn-Weinstein monofilament wire test within normal limits  bilaterally. Muscle power within normal limits bilaterally.  Nails Thick disfigured discolored nails with subungual debris  from hallux to fifth toes bilaterally. No evidence of bacterial infection or drainage bilaterally.  Orthopedic  No limitations of motion  feet .  No crepitus or effusions noted.  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  Skin  normotropic skin bilaterally. Porokeratosis sub 3  B/L. No signs of infections or ulcers noted.     Onychomycosis  Pain in right toes  Pain in left toes  Consent was obtained for treatment procedures.   Mechanical debridement of nails 1-5  bilaterally performed with a nail nipper.  Filed with dremel without incident.  Debride porokeratosis with # 15 blade.    Return office visit    3 months.                   Told patient to return for periodic foot care and evaluation due to potential at risk complications.   Gardiner Barefoot DPM

## 2020-06-02 DIAGNOSIS — H25012 Cortical age-related cataract, left eye: Secondary | ICD-10-CM | POA: Diagnosis not present

## 2020-06-02 DIAGNOSIS — H25812 Combined forms of age-related cataract, left eye: Secondary | ICD-10-CM | POA: Diagnosis not present

## 2020-06-02 DIAGNOSIS — H2512 Age-related nuclear cataract, left eye: Secondary | ICD-10-CM | POA: Diagnosis not present

## 2020-06-16 DIAGNOSIS — E785 Hyperlipidemia, unspecified: Secondary | ICD-10-CM | POA: Diagnosis not present

## 2020-06-16 DIAGNOSIS — E559 Vitamin D deficiency, unspecified: Secondary | ICD-10-CM | POA: Diagnosis not present

## 2020-06-16 DIAGNOSIS — E1129 Type 2 diabetes mellitus with other diabetic kidney complication: Secondary | ICD-10-CM | POA: Diagnosis not present

## 2020-06-21 DIAGNOSIS — Z23 Encounter for immunization: Secondary | ICD-10-CM | POA: Diagnosis not present

## 2020-06-21 DIAGNOSIS — E785 Hyperlipidemia, unspecified: Secondary | ICD-10-CM | POA: Diagnosis not present

## 2020-06-21 DIAGNOSIS — Z0000 Encounter for general adult medical examination without abnormal findings: Secondary | ICD-10-CM | POA: Diagnosis not present

## 2020-06-21 DIAGNOSIS — E1129 Type 2 diabetes mellitus with other diabetic kidney complication: Secondary | ICD-10-CM | POA: Diagnosis not present

## 2020-06-21 DIAGNOSIS — F39 Unspecified mood [affective] disorder: Secondary | ICD-10-CM | POA: Diagnosis not present

## 2020-06-21 DIAGNOSIS — I5030 Unspecified diastolic (congestive) heart failure: Secondary | ICD-10-CM | POA: Diagnosis not present

## 2020-06-21 DIAGNOSIS — I129 Hypertensive chronic kidney disease with stage 1 through stage 4 chronic kidney disease, or unspecified chronic kidney disease: Secondary | ICD-10-CM | POA: Diagnosis not present

## 2020-06-21 DIAGNOSIS — N1832 Chronic kidney disease, stage 3b: Secondary | ICD-10-CM | POA: Diagnosis not present

## 2020-08-05 ENCOUNTER — Ambulatory Visit: Payer: Medicare PPO | Admitting: Podiatry

## 2020-08-12 ENCOUNTER — Ambulatory Visit: Payer: Medicare PPO | Admitting: Podiatry

## 2020-08-19 ENCOUNTER — Encounter: Payer: Self-pay | Admitting: Podiatry

## 2020-08-19 ENCOUNTER — Ambulatory Visit: Payer: Medicare PPO | Admitting: Podiatry

## 2020-08-19 ENCOUNTER — Other Ambulatory Visit: Payer: Self-pay

## 2020-08-19 DIAGNOSIS — E119 Type 2 diabetes mellitus without complications: Secondary | ICD-10-CM | POA: Diagnosis not present

## 2020-08-19 DIAGNOSIS — M79674 Pain in right toe(s): Secondary | ICD-10-CM

## 2020-08-19 DIAGNOSIS — M79675 Pain in left toe(s): Secondary | ICD-10-CM | POA: Diagnosis not present

## 2020-08-19 DIAGNOSIS — B351 Tinea unguium: Secondary | ICD-10-CM | POA: Diagnosis not present

## 2020-08-19 NOTE — Progress Notes (Signed)
This patient returns to my office for at risk foot care.  This patient requires this care by a professional since this patient will be at risk due to having diabetes .  This patient is unable to cut nails herself since the patient cannot reach her nails.These nails are painful walking and wearing shoes.  This patient presents for at risk foot care today.    General Appearance  Alert, conversant and in no acute stress.  Vascular  Dorsalis pedis and posterior tibial  pulses are palpable  bilaterally.  Capillary return is within normal limits  bilaterally. Temperature is within normal limits  bilaterally.  Neurologic  Senn-Weinstein monofilament wire test within normal limits  bilaterally. Muscle power within normal limits bilaterally.  Nails Thick disfigured discolored nails with subungual debris  from hallux to fifth toes bilaterally. No evidence of bacterial infection or drainage bilaterally.  Orthopedic  No limitations of motion  feet .  No crepitus or effusions noted.  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  Skin  normotropic skin bilaterally. Porokeratosis sub 3  B/L asymptomatic.Marland Kitchen No signs of infections or ulcers noted.     Onychomycosis  Pain in right toes  Pain in left toes  Consent was obtained for treatment procedures.   Mechanical debridement of nails 1-5  bilaterally performed with a nail nipper.  Filed with dremel without incident.     Return office visit    4 months.                   Told patient to return for periodic foot care and evaluation due to potential at risk complications.   Gardiner Barefoot DPM

## 2020-10-10 DIAGNOSIS — H04123 Dry eye syndrome of bilateral lacrimal glands: Secondary | ICD-10-CM | POA: Diagnosis not present

## 2020-10-10 DIAGNOSIS — H0100A Unspecified blepharitis right eye, upper and lower eyelids: Secondary | ICD-10-CM | POA: Diagnosis not present

## 2020-10-10 DIAGNOSIS — H0100B Unspecified blepharitis left eye, upper and lower eyelids: Secondary | ICD-10-CM | POA: Diagnosis not present

## 2020-10-27 DIAGNOSIS — C50919 Malignant neoplasm of unspecified site of unspecified female breast: Secondary | ICD-10-CM | POA: Diagnosis not present

## 2020-10-27 DIAGNOSIS — I5030 Unspecified diastolic (congestive) heart failure: Secondary | ICD-10-CM | POA: Diagnosis not present

## 2020-10-27 DIAGNOSIS — N1832 Chronic kidney disease, stage 3b: Secondary | ICD-10-CM | POA: Diagnosis not present

## 2020-10-27 DIAGNOSIS — K635 Polyp of colon: Secondary | ICD-10-CM | POA: Diagnosis not present

## 2020-10-27 DIAGNOSIS — E785 Hyperlipidemia, unspecified: Secondary | ICD-10-CM | POA: Diagnosis not present

## 2020-10-27 DIAGNOSIS — E1129 Type 2 diabetes mellitus with other diabetic kidney complication: Secondary | ICD-10-CM | POA: Diagnosis not present

## 2020-10-27 DIAGNOSIS — M1990 Unspecified osteoarthritis, unspecified site: Secondary | ICD-10-CM | POA: Diagnosis not present

## 2020-10-27 DIAGNOSIS — I129 Hypertensive chronic kidney disease with stage 1 through stage 4 chronic kidney disease, or unspecified chronic kidney disease: Secondary | ICD-10-CM | POA: Diagnosis not present

## 2020-11-24 DIAGNOSIS — Z01419 Encounter for gynecological examination (general) (routine) without abnormal findings: Secondary | ICD-10-CM | POA: Diagnosis not present

## 2020-12-09 ENCOUNTER — Other Ambulatory Visit: Payer: Self-pay | Admitting: Obstetrics and Gynecology

## 2020-12-09 DIAGNOSIS — Z1231 Encounter for screening mammogram for malignant neoplasm of breast: Secondary | ICD-10-CM

## 2020-12-23 ENCOUNTER — Other Ambulatory Visit: Payer: Self-pay

## 2020-12-23 ENCOUNTER — Ambulatory Visit: Payer: Medicare PPO | Admitting: Podiatry

## 2020-12-23 ENCOUNTER — Encounter: Payer: Self-pay | Admitting: Podiatry

## 2020-12-23 DIAGNOSIS — E119 Type 2 diabetes mellitus without complications: Secondary | ICD-10-CM

## 2020-12-23 DIAGNOSIS — M79674 Pain in right toe(s): Secondary | ICD-10-CM

## 2020-12-23 DIAGNOSIS — B351 Tinea unguium: Secondary | ICD-10-CM

## 2020-12-23 DIAGNOSIS — M79675 Pain in left toe(s): Secondary | ICD-10-CM

## 2020-12-23 DIAGNOSIS — Q828 Other specified congenital malformations of skin: Secondary | ICD-10-CM | POA: Diagnosis not present

## 2020-12-23 NOTE — Progress Notes (Signed)
This patient returns to my office for at risk foot care.  This patient requires this care by a professional since this patient will be at risk due to having diabetes .  This patient is trim her callus right foot. These callus are painful walking and wearing shoes.  This patient presents for at risk foot care today.    General Appearance  Alert, conversant and in no acute stress.  Vascular  Dorsalis pedis and posterior tibial  pulses are palpable  bilaterally.  Capillary return is within normal limits  bilaterally. Temperature is within normal limits  bilaterally.  Neurologic  Senn-Weinstein monofilament wire test within normal limits  bilaterally. Muscle power within normal limits bilaterally.  Nails Thick disfigured discolored nails with subungual debris  from hallux to fifth toes bilaterally. No evidence of bacterial infection or drainage bilaterally.  Orthopedic  No limitations of motion  feet .  No crepitus or effusions noted.  No bony pathology or digital deformities noted.Hammer toes 2-5  B/l.PLantar flexed third metatarsal third  B/L.  Skin  normotropic skin bilaterally. Porokeratosis sub 3  B/L asymptomatic.Marland Kitchen No signs of infections or ulcers noted.     Porokeratosis right foot.  Consent was obtained for treatment procedures.  Debridement of callus right forefoot with # 15 blade.   Return office visit    3 months.                   Told patient to return for periodic foot care and evaluation due to potential at risk complications.   Gardiner Barefoot DPM

## 2021-02-02 ENCOUNTER — Ambulatory Visit
Admission: RE | Admit: 2021-02-02 | Discharge: 2021-02-02 | Disposition: A | Discharge status: Home / Self Care | Payer: Medicare PPO | Source: Ambulatory Visit | Attending: Obstetrics and Gynecology | Admitting: Obstetrics and Gynecology

## 2021-02-02 ENCOUNTER — Other Ambulatory Visit: Payer: Self-pay

## 2021-02-02 DIAGNOSIS — H1045 Other chronic allergic conjunctivitis: Secondary | ICD-10-CM | POA: Diagnosis not present

## 2021-02-02 DIAGNOSIS — E119 Type 2 diabetes mellitus without complications: Secondary | ICD-10-CM | POA: Diagnosis not present

## 2021-02-02 DIAGNOSIS — Z1231 Encounter for screening mammogram for malignant neoplasm of breast: Secondary | ICD-10-CM

## 2021-02-02 DIAGNOSIS — H40013 Open angle with borderline findings, low risk, bilateral: Secondary | ICD-10-CM | POA: Diagnosis not present

## 2021-02-02 DIAGNOSIS — Z961 Presence of intraocular lens: Secondary | ICD-10-CM | POA: Diagnosis not present

## 2021-02-02 DIAGNOSIS — H04123 Dry eye syndrome of bilateral lacrimal glands: Secondary | ICD-10-CM | POA: Diagnosis not present

## 2021-03-06 DIAGNOSIS — C50919 Malignant neoplasm of unspecified site of unspecified female breast: Secondary | ICD-10-CM | POA: Diagnosis not present

## 2021-03-17 DIAGNOSIS — M1990 Unspecified osteoarthritis, unspecified site: Secondary | ICD-10-CM | POA: Diagnosis not present

## 2021-03-17 DIAGNOSIS — I5030 Unspecified diastolic (congestive) heart failure: Secondary | ICD-10-CM | POA: Diagnosis not present

## 2021-03-17 DIAGNOSIS — M25571 Pain in right ankle and joints of right foot: Secondary | ICD-10-CM | POA: Diagnosis not present

## 2021-03-17 DIAGNOSIS — M24549 Contracture, unspecified hand: Secondary | ICD-10-CM | POA: Diagnosis not present

## 2021-03-17 DIAGNOSIS — N1832 Chronic kidney disease, stage 3b: Secondary | ICD-10-CM | POA: Diagnosis not present

## 2021-03-17 DIAGNOSIS — E1129 Type 2 diabetes mellitus with other diabetic kidney complication: Secondary | ICD-10-CM | POA: Diagnosis not present

## 2021-03-17 DIAGNOSIS — E785 Hyperlipidemia, unspecified: Secondary | ICD-10-CM | POA: Diagnosis not present

## 2021-03-17 DIAGNOSIS — I129 Hypertensive chronic kidney disease with stage 1 through stage 4 chronic kidney disease, or unspecified chronic kidney disease: Secondary | ICD-10-CM | POA: Diagnosis not present

## 2021-03-31 ENCOUNTER — Ambulatory Visit: Payer: Medicare PPO | Admitting: Podiatry

## 2021-03-31 ENCOUNTER — Other Ambulatory Visit: Payer: Self-pay

## 2021-03-31 DIAGNOSIS — E119 Type 2 diabetes mellitus without complications: Secondary | ICD-10-CM | POA: Diagnosis not present

## 2021-03-31 DIAGNOSIS — M25571 Pain in right ankle and joints of right foot: Secondary | ICD-10-CM | POA: Insufficient documentation

## 2021-03-31 DIAGNOSIS — Q828 Other specified congenital malformations of skin: Secondary | ICD-10-CM

## 2021-03-31 NOTE — Progress Notes (Signed)
This patient returns to my office for at risk foot care.  This patient requires this care by a professional since this patient will be at risk due to having diabetes .  This patient is trim her callus right foot. These callus are painful walking and wearing shoes.    Patient also says she is having ankle pain right ankle.  She was given diclolfenate cream by another doctor.This patient presents for at risk foot care today.    General Appearance  Alert, conversant and in no acute stress.  Vascular  Dorsalis pedis and posterior tibial  pulses are palpable  bilaterally.  Capillary return is within normal limits  bilaterally. Temperature is within normal limits  bilaterally.  Neurologic  Senn-Weinstein monofilament wire test within normal limits  bilaterally. Muscle power within normal limits bilaterally.  Nails Thick disfigured discolored nails with subungual debris  from hallux to fifth toes bilaterally. No evidence of bacterial infection or drainage bilaterally.  Orthopedic  No limitations of motion  feet .  No crepitus or effusions noted.  No bony pathology or digital deformities noted.Hammer toes 2-5  B/l.PLantar flexed third metatarsal third  B/L. Palpable sinus tarsi pain right ankle.  Skin  normotropic skin bilaterally. Porokeratosis sub 3  B/L asymptomatic.Marland Kitchen No signs of infections or ulcers noted.     Porokeratosis right foot.  Consent was obtained for treatment procedures.  Debridement of callus right forefoot with # 15 blade.  Discussed sinus tarsi pain with patient.  To wear better shoes and use medicine previously prescribed.  Possible injection if problem persists.   Return office visit    3 months.                   Told patient to return for periodic foot care and evaluation due to potential at risk complications.   Gardiner Barefoot DPM

## 2021-06-22 DIAGNOSIS — Z780 Asymptomatic menopausal state: Secondary | ICD-10-CM | POA: Diagnosis not present

## 2021-07-07 ENCOUNTER — Ambulatory Visit: Payer: Medicare PPO | Admitting: Podiatry

## 2021-07-24 DIAGNOSIS — E785 Hyperlipidemia, unspecified: Secondary | ICD-10-CM | POA: Diagnosis not present

## 2021-07-24 DIAGNOSIS — E1129 Type 2 diabetes mellitus with other diabetic kidney complication: Secondary | ICD-10-CM | POA: Diagnosis not present

## 2021-07-24 DIAGNOSIS — E559 Vitamin D deficiency, unspecified: Secondary | ICD-10-CM | POA: Diagnosis not present

## 2021-07-28 ENCOUNTER — Ambulatory Visit: Payer: Medicare PPO | Admitting: Podiatry

## 2021-07-31 DIAGNOSIS — C50919 Malignant neoplasm of unspecified site of unspecified female breast: Secondary | ICD-10-CM | POA: Diagnosis not present

## 2021-07-31 DIAGNOSIS — N1832 Chronic kidney disease, stage 3b: Secondary | ICD-10-CM | POA: Diagnosis not present

## 2021-07-31 DIAGNOSIS — Z0000 Encounter for general adult medical examination without abnormal findings: Secondary | ICD-10-CM | POA: Diagnosis not present

## 2021-07-31 DIAGNOSIS — Z23 Encounter for immunization: Secondary | ICD-10-CM | POA: Diagnosis not present

## 2021-07-31 DIAGNOSIS — K635 Polyp of colon: Secondary | ICD-10-CM | POA: Diagnosis not present

## 2021-07-31 DIAGNOSIS — E1129 Type 2 diabetes mellitus with other diabetic kidney complication: Secondary | ICD-10-CM | POA: Diagnosis not present

## 2021-07-31 DIAGNOSIS — I5030 Unspecified diastolic (congestive) heart failure: Secondary | ICD-10-CM | POA: Diagnosis not present

## 2021-07-31 DIAGNOSIS — E785 Hyperlipidemia, unspecified: Secondary | ICD-10-CM | POA: Diagnosis not present

## 2021-07-31 DIAGNOSIS — I129 Hypertensive chronic kidney disease with stage 1 through stage 4 chronic kidney disease, or unspecified chronic kidney disease: Secondary | ICD-10-CM | POA: Diagnosis not present

## 2021-08-11 ENCOUNTER — Ambulatory Visit: Payer: Medicare PPO | Admitting: Podiatry

## 2021-09-11 ENCOUNTER — Ambulatory Visit: Payer: Medicare PPO | Admitting: Podiatry

## 2021-09-11 ENCOUNTER — Encounter: Payer: Self-pay | Admitting: Podiatry

## 2021-09-11 ENCOUNTER — Other Ambulatory Visit: Payer: Self-pay

## 2021-09-11 DIAGNOSIS — Q828 Other specified congenital malformations of skin: Secondary | ICD-10-CM

## 2021-09-11 DIAGNOSIS — M79674 Pain in right toe(s): Secondary | ICD-10-CM

## 2021-09-11 DIAGNOSIS — E119 Type 2 diabetes mellitus without complications: Secondary | ICD-10-CM | POA: Diagnosis not present

## 2021-09-11 DIAGNOSIS — B351 Tinea unguium: Secondary | ICD-10-CM

## 2021-09-11 DIAGNOSIS — M79675 Pain in left toe(s): Secondary | ICD-10-CM

## 2021-09-11 NOTE — Progress Notes (Signed)
This patient returns to my office for at risk foot care.  This patient requires this care by a professional since this patient will be at risk due to having diabetes .  This patient is trim her callus right foot. These callus are painful walking and wearing shoes.    Patient also says she is having ankle pain right ankle.  She was given diclolfenate cream by another doctor.This patient presents for at risk foot care today.    General Appearance  Alert, conversant and in no acute stress.  Vascular  Dorsalis pedis and posterior tibial  pulses are palpable  bilaterally.  Capillary return is within normal limits  bilaterally. Temperature is within normal limits  bilaterally.  Neurologic  Senn-Weinstein monofilament wire test within normal limits  bilaterally. Muscle power within normal limits bilaterally.  Nails Thick disfigured discolored nails with subungual debris  from hallux to fifth toes bilaterally. No evidence of bacterial infection or drainage bilaterally.  Orthopedic  No limitations of motion  feet .  No crepitus or effusions noted.  No bony pathology or digital deformities noted.Hammer toes 2-5  B/l.PLantar flexed third metatarsal third  B/L. Palpable sinus tarsi pain right ankle.  Skin  normotropic skin bilaterally. Porokeratosis sub 3  B/L asymptomatic.Marland Kitchen No signs of infections or ulcers noted.     Porokeratosis right foot.  Consent was obtained for treatment procedures.  Debridement of callus right forefoot with # 15 blade.    Return office visit    3 months.                   Told patient to return for periodic foot care and evaluation due to potential at risk complications.   Gardiner Barefoot DPM

## 2021-09-15 IMAGING — MG DIGITAL SCREENING BILAT W/ TOMO W/ CAD
8 of 14 series · 8 of 40 positions shown · non-contrast
Comparison: Previous exam(s).

CLINICAL DATA: Screening.

EXAM:
DIGITAL SCREENING BILATERAL MAMMOGRAM WITH TOMO AND CAD

[R MLO synth-2D]
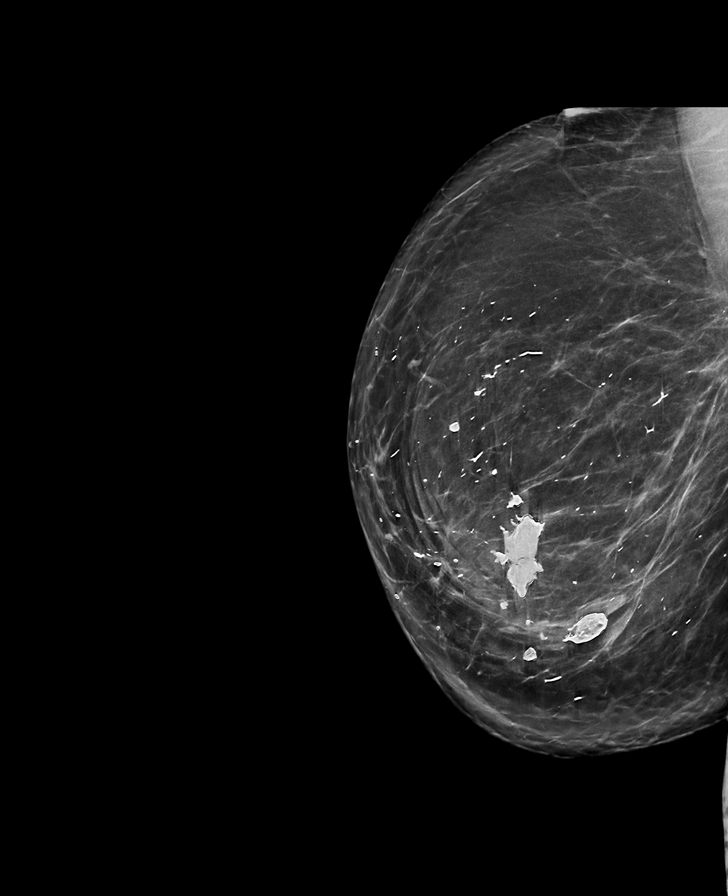

[R CC synth-2D (1 of 2)]
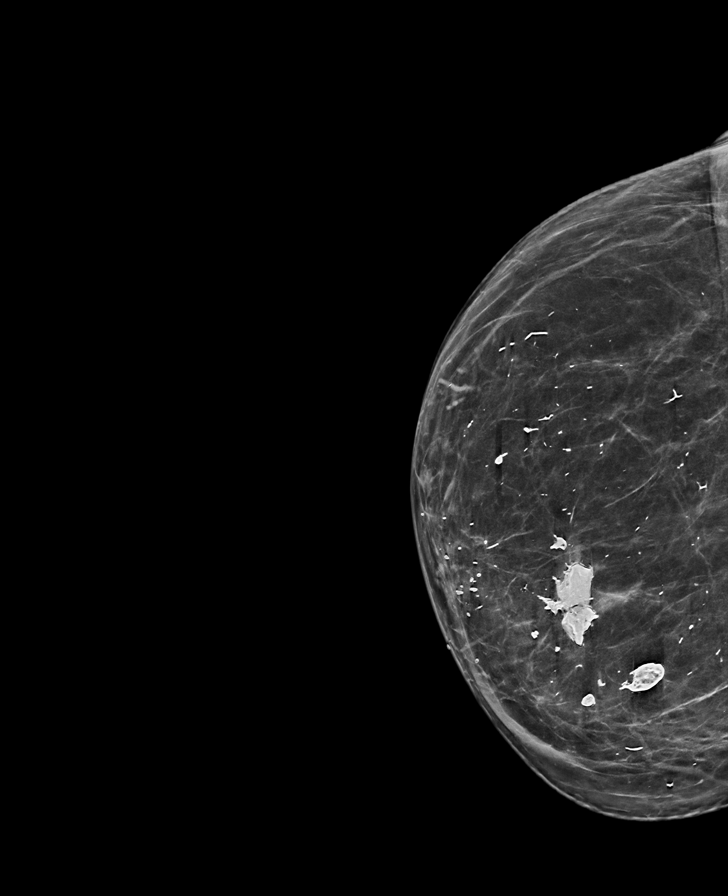

[L MLO synth-2D (1 of 2)]
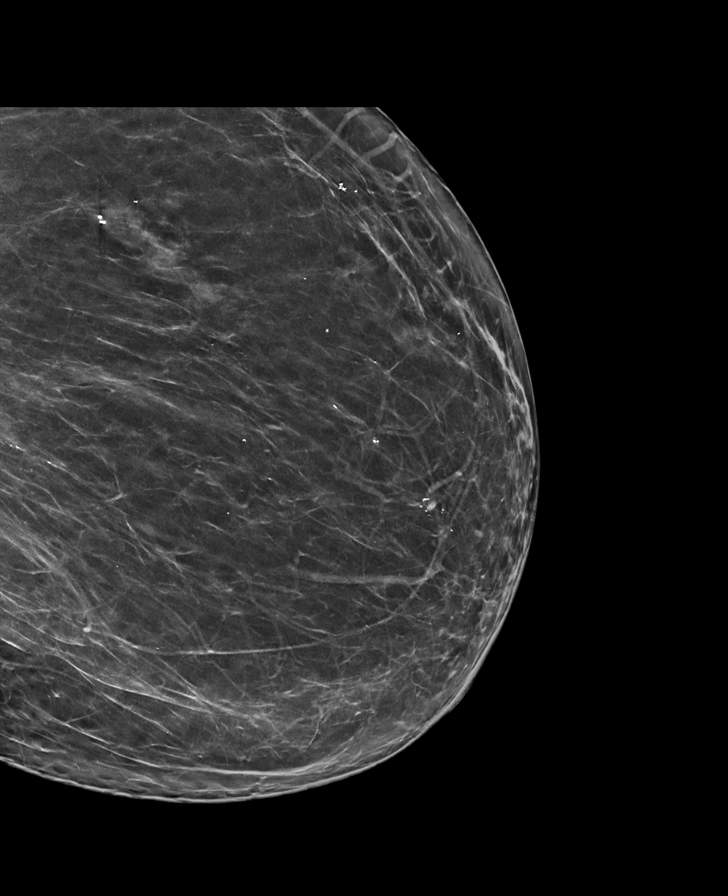

[L CC synth-2D (1 of 2)]
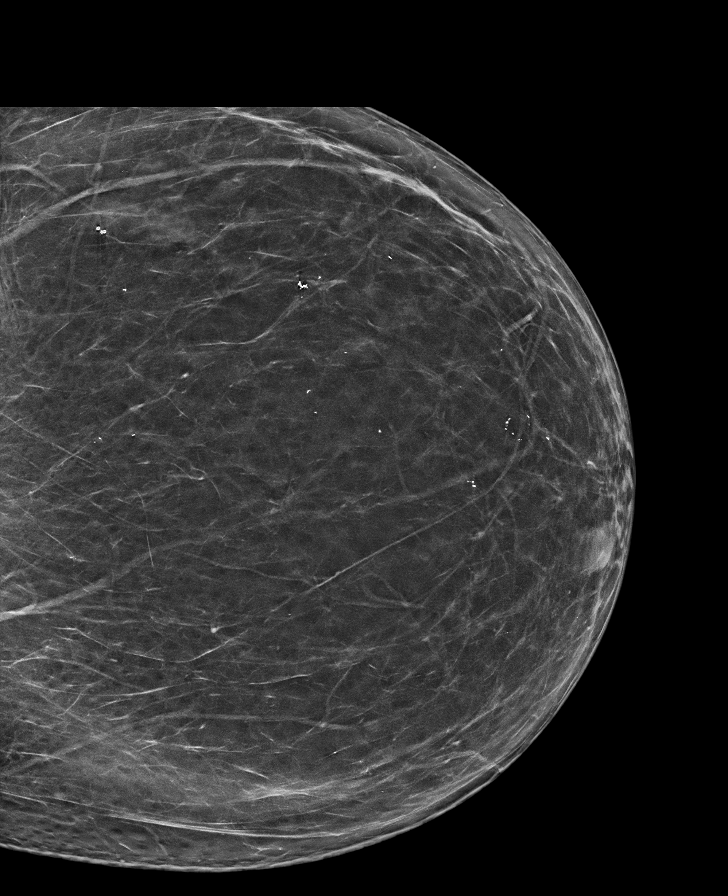

[L CC synth-2D (2 of 2)]
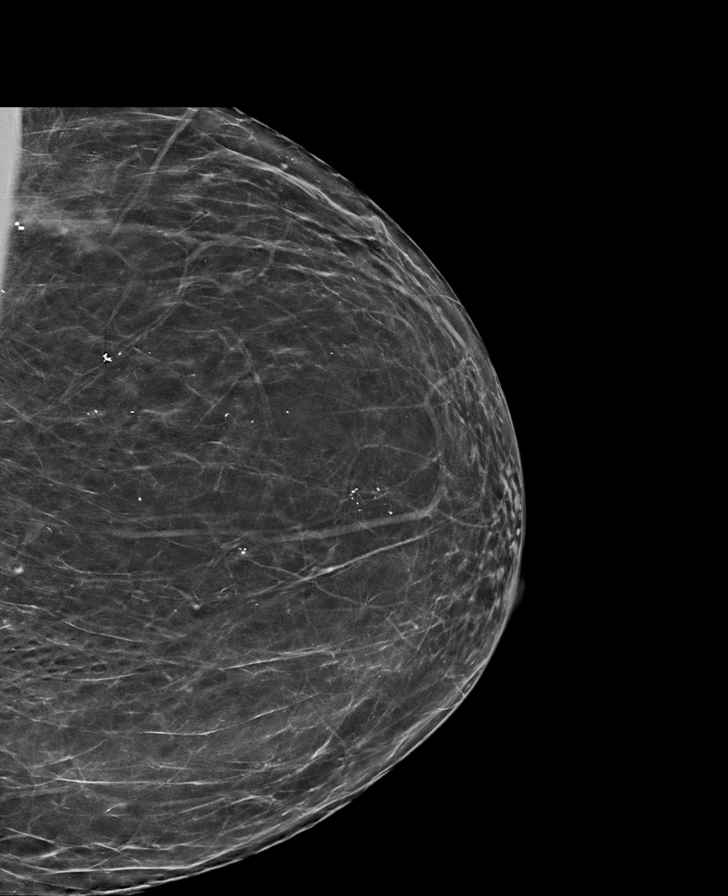

[R CC synth-2D (2 of 2)]
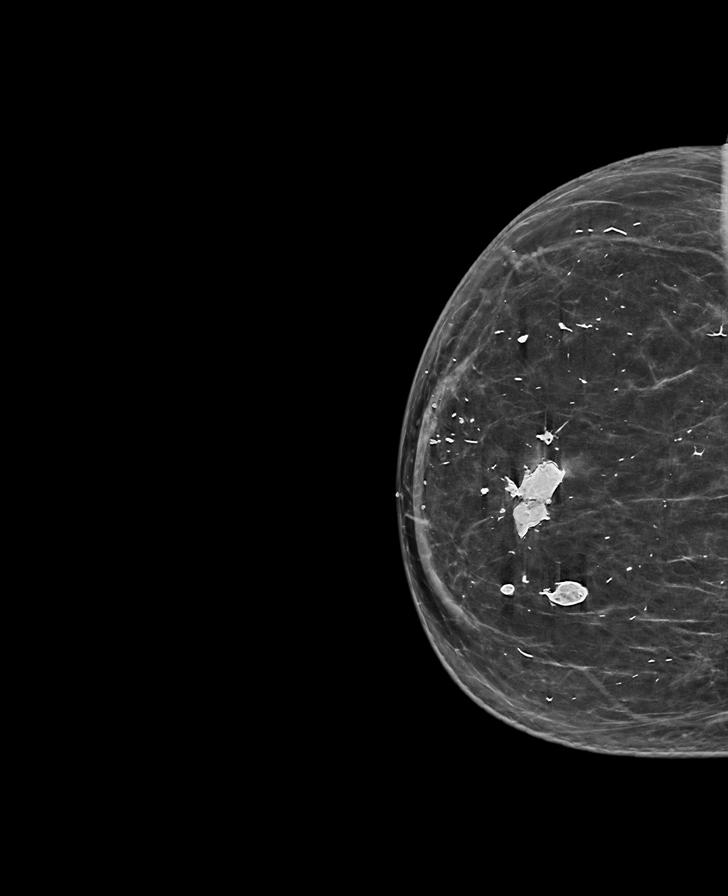

[L MLO synth-2D (2 of 2)]
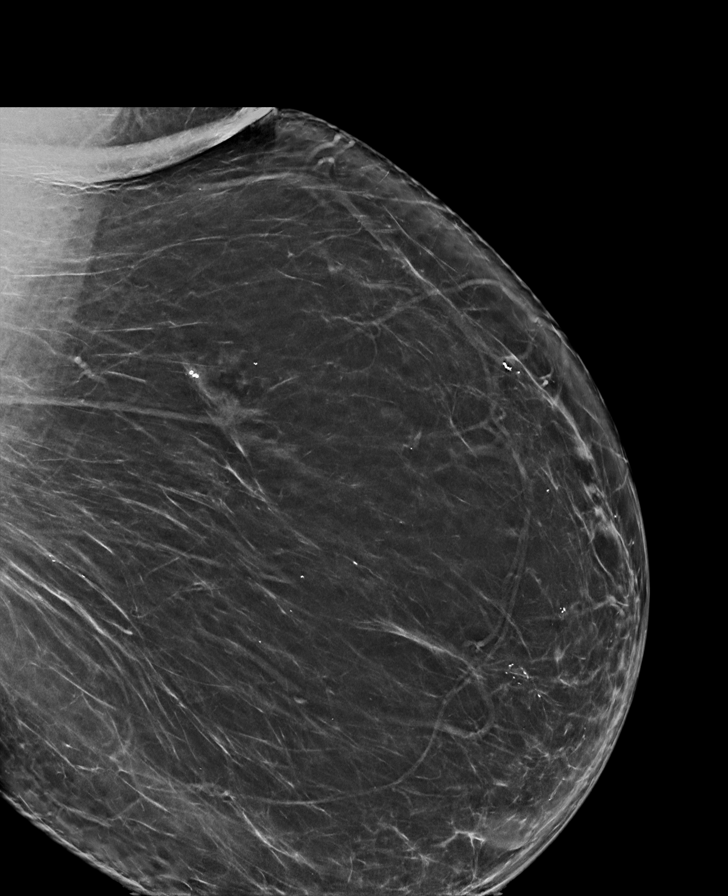

[R CC tomo · tomo slice 37/73.0]
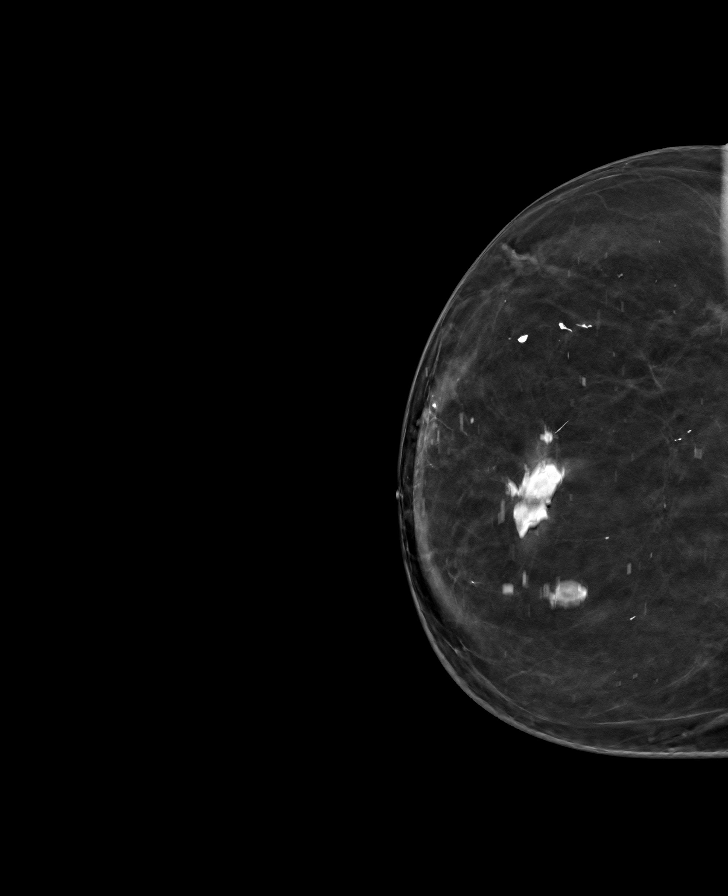

[8 of 40 positions shown; findings below may reference images not displayed]

ACR Breast Density Category b: There are scattered areas of
fibroglandular density.
FINDINGS: There are no findings suspicious for malignancy. Images were
processed with CAD.
IMPRESSION: No mammographic evidence of malignancy. A result letter of this
screening mammogram will be mailed directly to the patient.

RECOMMENDATION:
Screening mammogram in one year. (Code:[id number])

BI-RADS CATEGORY  1: Negative.

## 2021-11-01 DIAGNOSIS — E1129 Type 2 diabetes mellitus with other diabetic kidney complication: Secondary | ICD-10-CM | POA: Diagnosis not present

## 2021-11-01 DIAGNOSIS — I129 Hypertensive chronic kidney disease with stage 1 through stage 4 chronic kidney disease, or unspecified chronic kidney disease: Secondary | ICD-10-CM | POA: Diagnosis not present

## 2021-11-01 DIAGNOSIS — N1832 Chronic kidney disease, stage 3b: Secondary | ICD-10-CM | POA: Diagnosis not present

## 2021-11-01 DIAGNOSIS — Z794 Long term (current) use of insulin: Secondary | ICD-10-CM | POA: Diagnosis not present

## 2021-11-01 DIAGNOSIS — E785 Hyperlipidemia, unspecified: Secondary | ICD-10-CM | POA: Diagnosis not present

## 2021-12-14 DIAGNOSIS — N1832 Chronic kidney disease, stage 3b: Secondary | ICD-10-CM | POA: Diagnosis not present

## 2021-12-14 DIAGNOSIS — Z794 Long term (current) use of insulin: Secondary | ICD-10-CM | POA: Diagnosis not present

## 2021-12-14 DIAGNOSIS — E785 Hyperlipidemia, unspecified: Secondary | ICD-10-CM | POA: Diagnosis not present

## 2021-12-14 DIAGNOSIS — I129 Hypertensive chronic kidney disease with stage 1 through stage 4 chronic kidney disease, or unspecified chronic kidney disease: Secondary | ICD-10-CM | POA: Diagnosis not present

## 2021-12-14 DIAGNOSIS — E1129 Type 2 diabetes mellitus with other diabetic kidney complication: Secondary | ICD-10-CM | POA: Diagnosis not present

## 2021-12-14 DIAGNOSIS — E663 Overweight: Secondary | ICD-10-CM | POA: Diagnosis not present

## 2021-12-20 ENCOUNTER — Ambulatory Visit: Payer: Medicare PPO | Admitting: Podiatry

## 2021-12-29 ENCOUNTER — Other Ambulatory Visit: Payer: Self-pay | Admitting: Internal Medicine

## 2021-12-29 DIAGNOSIS — Z1231 Encounter for screening mammogram for malignant neoplasm of breast: Secondary | ICD-10-CM

## 2022-01-19 ENCOUNTER — Ambulatory Visit (INDEPENDENT_AMBULATORY_CARE_PROVIDER_SITE_OTHER): Payer: Medicare PPO | Admitting: Podiatry

## 2022-01-19 ENCOUNTER — Encounter: Payer: Self-pay | Admitting: Podiatry

## 2022-01-19 DIAGNOSIS — Q828 Other specified congenital malformations of skin: Secondary | ICD-10-CM

## 2022-01-19 DIAGNOSIS — M79675 Pain in left toe(s): Secondary | ICD-10-CM

## 2022-01-19 DIAGNOSIS — M79674 Pain in right toe(s): Secondary | ICD-10-CM | POA: Diagnosis not present

## 2022-01-19 DIAGNOSIS — E1129 Type 2 diabetes mellitus with other diabetic kidney complication: Secondary | ICD-10-CM | POA: Diagnosis not present

## 2022-01-19 DIAGNOSIS — B351 Tinea unguium: Secondary | ICD-10-CM

## 2022-01-19 DIAGNOSIS — E119 Type 2 diabetes mellitus without complications: Secondary | ICD-10-CM | POA: Diagnosis not present

## 2022-01-19 NOTE — Progress Notes (Signed)
This patient returns to my office for at risk foot care.  This patient requires this care by a professional since this patient will be at risk due to having diabetes .  This patient is trim her callus right foot. These callus are painful walking and wearing shoes.    Patient also says she is having ankle pain right ankle.  She was given diclolfenate cream by another doctor.This patient presents for at risk foot care today.   ? ?General Appearance  Alert, conversant and in no acute stress. ? ?Vascular  Dorsalis pedis and posterior tibial  pulses are palpable  bilaterally.  Capillary return is within normal limits  bilaterally. Temperature is within normal limits  bilaterally. ? ?Neurologic  Senn-Weinstein monofilament wire test within normal limits  bilaterally. Muscle power within normal limits bilaterally. ? ?Nails Thick disfigured discolored nails with subungual debris  from hallux to fifth toes bilaterally. No evidence of bacterial infection or drainage bilaterally. ? ?Orthopedic  No limitations of motion  feet .  No crepitus or effusions noted.  No bony pathology or digital deformities noted.Hammer toes 2-5  B/l.PLantar flexed third metatarsal third  B/L. Palpable sinus tarsi pain right ankle. ? ?Skin  normotropic skin bilaterally. Porokeratosis sub 3  B/L asymptomatic.Marland Kitchen No signs of infections or ulcers noted.    ? ?Porokeratosis right foot. ? ?Consent was obtained for treatment procedures.  Debridement of callus right forefoot with # 15 blade. Nails done as courtesy. ? ? ?Return office visit    3 months.                   Told patient to return for periodic foot care and evaluation due to potential at risk complications. ? ? ?Gardiner Barefoot DPM  ?

## 2022-02-06 ENCOUNTER — Ambulatory Visit
Admission: RE | Admit: 2022-02-06 | Discharge: 2022-02-06 | Disposition: A | Discharge status: Home / Self Care | Payer: Medicare PPO | Source: Ambulatory Visit | Attending: Internal Medicine | Admitting: Internal Medicine

## 2022-02-06 DIAGNOSIS — Z1231 Encounter for screening mammogram for malignant neoplasm of breast: Secondary | ICD-10-CM | POA: Diagnosis not present

## 2022-02-06 HISTORY — DX: Malignant neoplasm of unspecified site of unspecified female breast: C50.919

## 2022-02-07 ENCOUNTER — Other Ambulatory Visit: Payer: Self-pay | Admitting: Internal Medicine

## 2022-02-07 DIAGNOSIS — R928 Other abnormal and inconclusive findings on diagnostic imaging of breast: Secondary | ICD-10-CM

## 2022-02-07 DIAGNOSIS — H1045 Other chronic allergic conjunctivitis: Secondary | ICD-10-CM | POA: Diagnosis not present

## 2022-02-07 DIAGNOSIS — H04123 Dry eye syndrome of bilateral lacrimal glands: Secondary | ICD-10-CM | POA: Diagnosis not present

## 2022-02-07 DIAGNOSIS — E119 Type 2 diabetes mellitus without complications: Secondary | ICD-10-CM | POA: Diagnosis not present

## 2022-02-07 DIAGNOSIS — Z961 Presence of intraocular lens: Secondary | ICD-10-CM | POA: Diagnosis not present

## 2022-02-07 DIAGNOSIS — H40023 Open angle with borderline findings, high risk, bilateral: Secondary | ICD-10-CM | POA: Diagnosis not present

## 2022-02-07 DIAGNOSIS — H43811 Vitreous degeneration, right eye: Secondary | ICD-10-CM | POA: Diagnosis not present

## 2022-02-27 ENCOUNTER — Ambulatory Visit
Admission: RE | Admit: 2022-02-27 | Discharge: 2022-02-27 | Disposition: A | Discharge status: Home / Self Care | Payer: Medicare PPO | Source: Ambulatory Visit | Attending: Internal Medicine | Admitting: Internal Medicine

## 2022-02-27 DIAGNOSIS — R928 Other abnormal and inconclusive findings on diagnostic imaging of breast: Secondary | ICD-10-CM | POA: Diagnosis not present

## 2022-02-27 DIAGNOSIS — N6489 Other specified disorders of breast: Secondary | ICD-10-CM | POA: Diagnosis not present

## 2022-03-22 DIAGNOSIS — E1129 Type 2 diabetes mellitus with other diabetic kidney complication: Secondary | ICD-10-CM | POA: Diagnosis not present

## 2022-03-22 DIAGNOSIS — I5030 Unspecified diastolic (congestive) heart failure: Secondary | ICD-10-CM | POA: Diagnosis not present

## 2022-03-22 DIAGNOSIS — I129 Hypertensive chronic kidney disease with stage 1 through stage 4 chronic kidney disease, or unspecified chronic kidney disease: Secondary | ICD-10-CM | POA: Diagnosis not present

## 2022-03-22 DIAGNOSIS — K219 Gastro-esophageal reflux disease without esophagitis: Secondary | ICD-10-CM | POA: Diagnosis not present

## 2022-03-22 DIAGNOSIS — N1832 Chronic kidney disease, stage 3b: Secondary | ICD-10-CM | POA: Diagnosis not present

## 2022-03-22 DIAGNOSIS — F39 Unspecified mood [affective] disorder: Secondary | ICD-10-CM | POA: Diagnosis not present

## 2022-03-22 DIAGNOSIS — E785 Hyperlipidemia, unspecified: Secondary | ICD-10-CM | POA: Diagnosis not present

## 2022-03-22 DIAGNOSIS — C50919 Malignant neoplasm of unspecified site of unspecified female breast: Secondary | ICD-10-CM | POA: Diagnosis not present

## 2022-04-09 DIAGNOSIS — E1129 Type 2 diabetes mellitus with other diabetic kidney complication: Secondary | ICD-10-CM | POA: Diagnosis not present

## 2022-04-12 DIAGNOSIS — N1832 Chronic kidney disease, stage 3b: Secondary | ICD-10-CM | POA: Diagnosis not present

## 2022-04-12 DIAGNOSIS — I129 Hypertensive chronic kidney disease with stage 1 through stage 4 chronic kidney disease, or unspecified chronic kidney disease: Secondary | ICD-10-CM | POA: Diagnosis not present

## 2022-04-12 DIAGNOSIS — E1129 Type 2 diabetes mellitus with other diabetic kidney complication: Secondary | ICD-10-CM | POA: Diagnosis not present

## 2022-04-12 DIAGNOSIS — Z794 Long term (current) use of insulin: Secondary | ICD-10-CM | POA: Diagnosis not present

## 2022-04-12 DIAGNOSIS — E785 Hyperlipidemia, unspecified: Secondary | ICD-10-CM | POA: Diagnosis not present

## 2022-04-23 ENCOUNTER — Ambulatory Visit: Payer: Medicare PPO | Admitting: Podiatry

## 2022-06-07 ENCOUNTER — Encounter: Payer: Self-pay | Admitting: Podiatry

## 2022-06-13 ENCOUNTER — Ambulatory Visit: Payer: Medicare PPO | Admitting: Podiatry

## 2022-06-27 DIAGNOSIS — C50919 Malignant neoplasm of unspecified site of unspecified female breast: Secondary | ICD-10-CM | POA: Diagnosis not present

## 2022-06-28 DIAGNOSIS — E1129 Type 2 diabetes mellitus with other diabetic kidney complication: Secondary | ICD-10-CM | POA: Diagnosis not present

## 2022-07-03 ENCOUNTER — Ambulatory Visit: Payer: Medicare PPO | Admitting: Podiatry

## 2022-07-17 ENCOUNTER — Ambulatory Visit (INDEPENDENT_AMBULATORY_CARE_PROVIDER_SITE_OTHER): Payer: Medicare PPO | Admitting: Podiatry

## 2022-07-17 ENCOUNTER — Encounter: Payer: Self-pay | Admitting: Podiatry

## 2022-07-17 DIAGNOSIS — Q828 Other specified congenital malformations of skin: Secondary | ICD-10-CM | POA: Diagnosis not present

## 2022-07-17 DIAGNOSIS — M79674 Pain in right toe(s): Secondary | ICD-10-CM | POA: Diagnosis not present

## 2022-07-17 DIAGNOSIS — E119 Type 2 diabetes mellitus without complications: Secondary | ICD-10-CM

## 2022-07-17 DIAGNOSIS — B351 Tinea unguium: Secondary | ICD-10-CM

## 2022-07-17 DIAGNOSIS — M79675 Pain in left toe(s): Secondary | ICD-10-CM

## 2022-07-17 NOTE — Progress Notes (Signed)
This patient returns to my office for at risk foot care.  This patient requires this care by a professional since this patient will be at risk due to having diabetes .  This patient is trim her callus right foot. These callus are painful walking and wearing shoes.    Patient also says she is having ankle pain right ankle.  She was given diclolfenate cream by another doctor.This patient presents for at risk foot care today.    General Appearance  Alert, conversant and in no acute stress.  Vascular  Dorsalis pedis and posterior tibial  pulses are palpable  bilaterally.  Capillary return is within normal limits  bilaterally. Temperature is within normal limits  bilaterally.  Neurologic  Senn-Weinstein monofilament wire test within normal limits  bilaterally. Muscle power within normal limits bilaterally.  Nails Thick disfigured discolored nails with subungual debris  from hallux to fifth toes bilaterally. No evidence of bacterial infection or drainage bilaterally.  Orthopedic  No limitations of motion  feet .  No crepitus or effusions noted.  No bony pathology or digital deformities noted.Hammer toes 2-5  B/l.PLantar flexed third metatarsal third  B/L. Palpable sinus tarsi pain right ankle.  Skin  normotropic skin bilaterally. Porokeratosis sub 3  B/L asymptomatic.Marland Kitchen No signs of infections or ulcers noted.     Porokeratosis right foot.  Consent was obtained for treatment procedures.  Debridement of callus left forefoot with # 15 blade. Nails done as courtesy.   Return office visit    3 months.                   Told patient to return for periodic foot care and evaluation due to potential at risk complications.   Gardiner Barefoot DPM

## 2022-07-19 DIAGNOSIS — E1129 Type 2 diabetes mellitus with other diabetic kidney complication: Secondary | ICD-10-CM | POA: Diagnosis not present

## 2022-07-19 DIAGNOSIS — N1832 Chronic kidney disease, stage 3b: Secondary | ICD-10-CM | POA: Diagnosis not present

## 2022-07-19 DIAGNOSIS — E785 Hyperlipidemia, unspecified: Secondary | ICD-10-CM | POA: Diagnosis not present

## 2022-07-19 DIAGNOSIS — I129 Hypertensive chronic kidney disease with stage 1 through stage 4 chronic kidney disease, or unspecified chronic kidney disease: Secondary | ICD-10-CM | POA: Diagnosis not present

## 2022-07-19 DIAGNOSIS — Z794 Long term (current) use of insulin: Secondary | ICD-10-CM | POA: Diagnosis not present

## 2022-07-27 ENCOUNTER — Ambulatory Visit: Payer: Medicare PPO | Admitting: Podiatry

## 2022-08-10 DIAGNOSIS — C50919 Malignant neoplasm of unspecified site of unspecified female breast: Secondary | ICD-10-CM | POA: Diagnosis not present

## 2022-09-10 ENCOUNTER — Ambulatory Visit: Payer: Medicare PPO | Admitting: Podiatry

## 2022-09-17 DIAGNOSIS — E1129 Type 2 diabetes mellitus with other diabetic kidney complication: Secondary | ICD-10-CM | POA: Diagnosis not present

## 2022-09-19 DIAGNOSIS — H1045 Other chronic allergic conjunctivitis: Secondary | ICD-10-CM | POA: Diagnosis not present

## 2022-09-19 DIAGNOSIS — H43392 Other vitreous opacities, left eye: Secondary | ICD-10-CM | POA: Diagnosis not present

## 2022-09-25 DIAGNOSIS — E1129 Type 2 diabetes mellitus with other diabetic kidney complication: Secondary | ICD-10-CM | POA: Diagnosis not present

## 2022-09-25 DIAGNOSIS — E559 Vitamin D deficiency, unspecified: Secondary | ICD-10-CM | POA: Diagnosis not present

## 2022-09-25 DIAGNOSIS — N1832 Chronic kidney disease, stage 3b: Secondary | ICD-10-CM | POA: Diagnosis not present

## 2022-09-25 DIAGNOSIS — E785 Hyperlipidemia, unspecified: Secondary | ICD-10-CM | POA: Diagnosis not present

## 2022-09-25 DIAGNOSIS — Z1211 Encounter for screening for malignant neoplasm of colon: Secondary | ICD-10-CM | POA: Diagnosis not present

## 2022-10-02 DIAGNOSIS — Z0000 Encounter for general adult medical examination without abnormal findings: Secondary | ICD-10-CM | POA: Diagnosis not present

## 2022-10-02 DIAGNOSIS — E1129 Type 2 diabetes mellitus with other diabetic kidney complication: Secondary | ICD-10-CM | POA: Diagnosis not present

## 2022-10-02 DIAGNOSIS — I129 Hypertensive chronic kidney disease with stage 1 through stage 4 chronic kidney disease, or unspecified chronic kidney disease: Secondary | ICD-10-CM | POA: Diagnosis not present

## 2022-10-02 DIAGNOSIS — F39 Unspecified mood [affective] disorder: Secondary | ICD-10-CM | POA: Diagnosis not present

## 2022-10-02 DIAGNOSIS — I5030 Unspecified diastolic (congestive) heart failure: Secondary | ICD-10-CM | POA: Diagnosis not present

## 2022-10-02 DIAGNOSIS — E785 Hyperlipidemia, unspecified: Secondary | ICD-10-CM | POA: Diagnosis not present

## 2022-10-02 DIAGNOSIS — N1832 Chronic kidney disease, stage 3b: Secondary | ICD-10-CM | POA: Diagnosis not present

## 2022-10-02 DIAGNOSIS — C50919 Malignant neoplasm of unspecified site of unspecified female breast: Secondary | ICD-10-CM | POA: Diagnosis not present

## 2022-10-17 ENCOUNTER — Ambulatory Visit: Payer: Medicare PPO | Admitting: Podiatry

## 2022-11-14 ENCOUNTER — Encounter: Payer: Self-pay | Admitting: Podiatry

## 2022-11-14 ENCOUNTER — Ambulatory Visit: Payer: Medicare PPO | Admitting: Podiatry

## 2022-11-14 VITALS — BP 126/60 | HR 60

## 2022-11-14 DIAGNOSIS — M79675 Pain in left toe(s): Secondary | ICD-10-CM

## 2022-11-14 DIAGNOSIS — M79674 Pain in right toe(s): Secondary | ICD-10-CM

## 2022-11-14 DIAGNOSIS — E119 Type 2 diabetes mellitus without complications: Secondary | ICD-10-CM

## 2022-11-14 DIAGNOSIS — B351 Tinea unguium: Secondary | ICD-10-CM | POA: Diagnosis not present

## 2022-11-14 DIAGNOSIS — Q828 Other specified congenital malformations of skin: Secondary | ICD-10-CM

## 2022-11-14 NOTE — Progress Notes (Signed)
This patient returns to my office for at risk foot care.  This patient requires this care by a professional since this patient will be at risk due to having diabetes .  This patient is trim her callus both feet. These callus are painful walking and wearing shoes.  Patient has thick painful nails both feet.  This patient presents for at risk foot care today.    General Appearance  Alert, conversant and in no acute stress.  Vascular  Dorsalis pedis and posterior tibial  pulses are palpable  bilaterally.  Capillary return is within normal limits  bilaterally. Temperature is within normal limits  bilaterally.  Neurologic  Senn-Weinstein monofilament wire test within normal limits  bilaterally. Muscle power within normal limits bilaterally.  Nails Thick disfigured discolored nails with subungual debris  from hallux to fifth toes bilaterally. No evidence of bacterial infection or drainage bilaterally.  Orthopedic  No limitations of motion  feet .  No crepitus or effusions noted.  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  PLantar flexed third metatarsal third  B/L. Palpable sinus tarsi pain right ankle.  Skin  normotropic skin bilaterally. Porokeratosis sub 3  B/L . No signs of infections or ulcers noted.     Onychomycosis  B/L,  Porokeratosis  B/L.  Consent was obtained for treatment procedures.  Debridement of nails with nail nipper and dremel tool.  Debride porokeratosis with # 15 blade and dremel tool.   Return office visit    3 months.                   Told patient to return for periodic foot care and evaluation due to potential at risk complications.   Gardiner Barefoot DPM

## 2022-12-16 DIAGNOSIS — E1129 Type 2 diabetes mellitus with other diabetic kidney complication: Secondary | ICD-10-CM | POA: Diagnosis not present

## 2023-01-16 ENCOUNTER — Other Ambulatory Visit: Payer: Self-pay | Admitting: Internal Medicine

## 2023-01-16 DIAGNOSIS — Z1231 Encounter for screening mammogram for malignant neoplasm of breast: Secondary | ICD-10-CM

## 2023-01-23 DIAGNOSIS — E785 Hyperlipidemia, unspecified: Secondary | ICD-10-CM | POA: Diagnosis not present

## 2023-01-23 DIAGNOSIS — I129 Hypertensive chronic kidney disease with stage 1 through stage 4 chronic kidney disease, or unspecified chronic kidney disease: Secondary | ICD-10-CM | POA: Diagnosis not present

## 2023-01-23 DIAGNOSIS — Z794 Long term (current) use of insulin: Secondary | ICD-10-CM | POA: Diagnosis not present

## 2023-01-23 DIAGNOSIS — E1129 Type 2 diabetes mellitus with other diabetic kidney complication: Secondary | ICD-10-CM | POA: Diagnosis not present

## 2023-01-23 DIAGNOSIS — N1832 Chronic kidney disease, stage 3b: Secondary | ICD-10-CM | POA: Diagnosis not present

## 2023-01-23 DIAGNOSIS — M546 Pain in thoracic spine: Secondary | ICD-10-CM | POA: Diagnosis not present

## 2023-02-12 DIAGNOSIS — E119 Type 2 diabetes mellitus without complications: Secondary | ICD-10-CM | POA: Diagnosis not present

## 2023-02-12 DIAGNOSIS — H1045 Other chronic allergic conjunctivitis: Secondary | ICD-10-CM | POA: Diagnosis not present

## 2023-02-12 DIAGNOSIS — H04123 Dry eye syndrome of bilateral lacrimal glands: Secondary | ICD-10-CM | POA: Diagnosis not present

## 2023-02-12 DIAGNOSIS — H43811 Vitreous degeneration, right eye: Secondary | ICD-10-CM | POA: Diagnosis not present

## 2023-02-12 DIAGNOSIS — H43392 Other vitreous opacities, left eye: Secondary | ICD-10-CM | POA: Diagnosis not present

## 2023-02-12 DIAGNOSIS — H40023 Open angle with borderline findings, high risk, bilateral: Secondary | ICD-10-CM | POA: Diagnosis not present

## 2023-02-18 ENCOUNTER — Ambulatory Visit: Payer: Medicare PPO | Admitting: Podiatry

## 2023-02-28 ENCOUNTER — Ambulatory Visit
Admission: RE | Admit: 2023-02-28 | Discharge: 2023-02-28 | Disposition: A | Discharge status: Home / Self Care | Payer: Medicare PPO | Source: Ambulatory Visit | Attending: Internal Medicine | Admitting: Internal Medicine

## 2023-02-28 DIAGNOSIS — Z1231 Encounter for screening mammogram for malignant neoplasm of breast: Secondary | ICD-10-CM

## 2023-03-12 DIAGNOSIS — E785 Hyperlipidemia, unspecified: Secondary | ICD-10-CM | POA: Diagnosis not present

## 2023-03-12 DIAGNOSIS — C50919 Malignant neoplasm of unspecified site of unspecified female breast: Secondary | ICD-10-CM | POA: Diagnosis not present

## 2023-03-12 DIAGNOSIS — J309 Allergic rhinitis, unspecified: Secondary | ICD-10-CM | POA: Diagnosis not present

## 2023-03-12 DIAGNOSIS — N1832 Chronic kidney disease, stage 3b: Secondary | ICD-10-CM | POA: Diagnosis not present

## 2023-03-12 DIAGNOSIS — I5030 Unspecified diastolic (congestive) heart failure: Secondary | ICD-10-CM | POA: Diagnosis not present

## 2023-03-12 DIAGNOSIS — I130 Hypertensive heart and chronic kidney disease with heart failure and stage 1 through stage 4 chronic kidney disease, or unspecified chronic kidney disease: Secondary | ICD-10-CM | POA: Diagnosis not present

## 2023-03-12 DIAGNOSIS — E1129 Type 2 diabetes mellitus with other diabetic kidney complication: Secondary | ICD-10-CM | POA: Diagnosis not present

## 2023-03-20 ENCOUNTER — Encounter: Payer: Self-pay | Admitting: Podiatry

## 2023-03-20 ENCOUNTER — Ambulatory Visit: Payer: Medicare PPO | Admitting: Podiatry

## 2023-03-20 VITALS — BP 124/64

## 2023-03-20 DIAGNOSIS — B351 Tinea unguium: Secondary | ICD-10-CM

## 2023-03-20 DIAGNOSIS — Q828 Other specified congenital malformations of skin: Secondary | ICD-10-CM | POA: Diagnosis not present

## 2023-03-20 DIAGNOSIS — M79675 Pain in left toe(s): Secondary | ICD-10-CM

## 2023-03-20 DIAGNOSIS — E119 Type 2 diabetes mellitus without complications: Secondary | ICD-10-CM | POA: Diagnosis not present

## 2023-03-20 DIAGNOSIS — M79674 Pain in right toe(s): Secondary | ICD-10-CM | POA: Diagnosis not present

## 2023-03-20 NOTE — Progress Notes (Signed)
This patient returns to my office for at risk foot care.  This patient requires this care by a professional since this patient will be at risk due to having diabetes .  This patient is trim her callus both feet. These callus are painful walking and wearing shoes.  Patient has thick painful nails both feet.  This patient presents for at risk foot care today.    General Appearance  Alert, conversant and in no acute stress.  Vascular  Dorsalis pedis and posterior tibial  pulses are palpable  bilaterally.  Capillary return is within normal limits  bilaterally. Temperature is within normal limits  bilaterally.  Neurologic  Senn-Weinstein monofilament wire test within normal limits  bilaterally. Muscle power within normal limits bilaterally.  Nails Thick disfigured discolored nails with subungual debris  from hallux to fifth toes bilaterally. No evidence of bacterial infection or drainage bilaterally.  Orthopedic  No limitations of motion  feet .  No crepitus or effusions noted.  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  PLantar flexed third metatarsal third  B/L. Palpable sinus tarsi pain right ankle.  Skin  normotropic skin bilaterally. Porokeratosis sub 3  B/L . No signs of infections or ulcers noted.     Onychomycosis  B/L,  Porokeratosis  B/L.  Consent was obtained for treatment procedures.  Debridement of nails with nail nipper and dremel tool.  Debride porokeratosis with # 15 blade and dremel tool. Gave gel dispersion sheet.   Return office visit    3 months.                   Told patient to return for periodic foot care and evaluation due to potential at risk complications.   Helane Gunther DPM

## 2023-03-22 DIAGNOSIS — E1129 Type 2 diabetes mellitus with other diabetic kidney complication: Secondary | ICD-10-CM | POA: Diagnosis not present

## 2023-03-29 DIAGNOSIS — E1129 Type 2 diabetes mellitus with other diabetic kidney complication: Secondary | ICD-10-CM | POA: Diagnosis not present

## 2023-06-06 DIAGNOSIS — Z794 Long term (current) use of insulin: Secondary | ICD-10-CM | POA: Diagnosis not present

## 2023-06-06 DIAGNOSIS — E785 Hyperlipidemia, unspecified: Secondary | ICD-10-CM | POA: Diagnosis not present

## 2023-06-06 DIAGNOSIS — I129 Hypertensive chronic kidney disease with stage 1 through stage 4 chronic kidney disease, or unspecified chronic kidney disease: Secondary | ICD-10-CM | POA: Diagnosis not present

## 2023-06-06 DIAGNOSIS — E1129 Type 2 diabetes mellitus with other diabetic kidney complication: Secondary | ICD-10-CM | POA: Diagnosis not present

## 2023-06-06 DIAGNOSIS — N1832 Chronic kidney disease, stage 3b: Secondary | ICD-10-CM | POA: Diagnosis not present

## 2023-06-20 ENCOUNTER — Ambulatory Visit: Payer: Medicare PPO | Admitting: Podiatry

## 2023-06-20 DIAGNOSIS — E1129 Type 2 diabetes mellitus with other diabetic kidney complication: Secondary | ICD-10-CM | POA: Diagnosis not present

## 2023-07-03 ENCOUNTER — Ambulatory Visit: Payer: Medicare PPO | Admitting: Podiatry

## 2023-07-17 DIAGNOSIS — I5030 Unspecified diastolic (congestive) heart failure: Secondary | ICD-10-CM | POA: Diagnosis not present

## 2023-07-17 DIAGNOSIS — Z23 Encounter for immunization: Secondary | ICD-10-CM | POA: Diagnosis not present

## 2023-07-17 DIAGNOSIS — E1129 Type 2 diabetes mellitus with other diabetic kidney complication: Secondary | ICD-10-CM | POA: Diagnosis not present

## 2023-07-17 DIAGNOSIS — K219 Gastro-esophageal reflux disease without esophagitis: Secondary | ICD-10-CM | POA: Diagnosis not present

## 2023-07-17 DIAGNOSIS — C50919 Malignant neoplasm of unspecified site of unspecified female breast: Secondary | ICD-10-CM | POA: Diagnosis not present

## 2023-07-17 DIAGNOSIS — N1832 Chronic kidney disease, stage 3b: Secondary | ICD-10-CM | POA: Diagnosis not present

## 2023-07-17 DIAGNOSIS — J309 Allergic rhinitis, unspecified: Secondary | ICD-10-CM | POA: Diagnosis not present

## 2023-07-17 DIAGNOSIS — I129 Hypertensive chronic kidney disease with stage 1 through stage 4 chronic kidney disease, or unspecified chronic kidney disease: Secondary | ICD-10-CM | POA: Diagnosis not present

## 2023-08-05 ENCOUNTER — Ambulatory Visit: Payer: Medicare PPO | Admitting: Podiatry

## 2023-09-18 DIAGNOSIS — E1129 Type 2 diabetes mellitus with other diabetic kidney complication: Secondary | ICD-10-CM | POA: Diagnosis not present

## 2023-10-08 DIAGNOSIS — Z1212 Encounter for screening for malignant neoplasm of rectum: Secondary | ICD-10-CM | POA: Diagnosis not present

## 2023-10-08 DIAGNOSIS — I129 Hypertensive chronic kidney disease with stage 1 through stage 4 chronic kidney disease, or unspecified chronic kidney disease: Secondary | ICD-10-CM | POA: Diagnosis not present

## 2023-10-08 DIAGNOSIS — E1129 Type 2 diabetes mellitus with other diabetic kidney complication: Secondary | ICD-10-CM | POA: Diagnosis not present

## 2023-10-08 DIAGNOSIS — E785 Hyperlipidemia, unspecified: Secondary | ICD-10-CM | POA: Diagnosis not present

## 2023-10-08 DIAGNOSIS — N1832 Chronic kidney disease, stage 3b: Secondary | ICD-10-CM | POA: Diagnosis not present

## 2023-10-09 DIAGNOSIS — N1832 Chronic kidney disease, stage 3b: Secondary | ICD-10-CM | POA: Diagnosis not present

## 2023-10-09 DIAGNOSIS — Z794 Long term (current) use of insulin: Secondary | ICD-10-CM | POA: Diagnosis not present

## 2023-10-09 DIAGNOSIS — E1129 Type 2 diabetes mellitus with other diabetic kidney complication: Secondary | ICD-10-CM | POA: Diagnosis not present

## 2023-10-09 DIAGNOSIS — I129 Hypertensive chronic kidney disease with stage 1 through stage 4 chronic kidney disease, or unspecified chronic kidney disease: Secondary | ICD-10-CM | POA: Diagnosis not present

## 2023-10-09 DIAGNOSIS — E785 Hyperlipidemia, unspecified: Secondary | ICD-10-CM | POA: Diagnosis not present

## 2023-10-15 DIAGNOSIS — Z23 Encounter for immunization: Secondary | ICD-10-CM | POA: Diagnosis not present

## 2023-10-15 DIAGNOSIS — Z0000 Encounter for general adult medical examination without abnormal findings: Secondary | ICD-10-CM | POA: Diagnosis not present

## 2023-10-15 DIAGNOSIS — I130 Hypertensive heart and chronic kidney disease with heart failure and stage 1 through stage 4 chronic kidney disease, or unspecified chronic kidney disease: Secondary | ICD-10-CM | POA: Diagnosis not present

## 2023-10-15 DIAGNOSIS — C50919 Malignant neoplasm of unspecified site of unspecified female breast: Secondary | ICD-10-CM | POA: Diagnosis not present

## 2023-10-15 DIAGNOSIS — I5030 Unspecified diastolic (congestive) heart failure: Secondary | ICD-10-CM | POA: Diagnosis not present

## 2023-10-15 DIAGNOSIS — E1129 Type 2 diabetes mellitus with other diabetic kidney complication: Secondary | ICD-10-CM | POA: Diagnosis not present

## 2023-10-15 DIAGNOSIS — E785 Hyperlipidemia, unspecified: Secondary | ICD-10-CM | POA: Diagnosis not present

## 2023-10-15 DIAGNOSIS — J309 Allergic rhinitis, unspecified: Secondary | ICD-10-CM | POA: Diagnosis not present

## 2023-10-15 DIAGNOSIS — N1832 Chronic kidney disease, stage 3b: Secondary | ICD-10-CM | POA: Diagnosis not present

## 2023-10-21 DIAGNOSIS — C50911 Malignant neoplasm of unspecified site of right female breast: Secondary | ICD-10-CM | POA: Diagnosis not present

## 2023-10-21 DIAGNOSIS — C50919 Malignant neoplasm of unspecified site of unspecified female breast: Secondary | ICD-10-CM | POA: Diagnosis not present

## 2023-11-18 DIAGNOSIS — C50919 Malignant neoplasm of unspecified site of unspecified female breast: Secondary | ICD-10-CM | POA: Diagnosis not present

## 2023-11-18 DIAGNOSIS — C50911 Malignant neoplasm of unspecified site of right female breast: Secondary | ICD-10-CM | POA: Diagnosis not present

## 2023-12-17 DIAGNOSIS — E1129 Type 2 diabetes mellitus with other diabetic kidney complication: Secondary | ICD-10-CM | POA: Diagnosis not present

## 2024-01-21 DIAGNOSIS — N1832 Chronic kidney disease, stage 3b: Secondary | ICD-10-CM | POA: Diagnosis not present

## 2024-01-21 DIAGNOSIS — I129 Hypertensive chronic kidney disease with stage 1 through stage 4 chronic kidney disease, or unspecified chronic kidney disease: Secondary | ICD-10-CM | POA: Diagnosis not present

## 2024-01-21 DIAGNOSIS — E785 Hyperlipidemia, unspecified: Secondary | ICD-10-CM | POA: Diagnosis not present

## 2024-01-21 DIAGNOSIS — E1129 Type 2 diabetes mellitus with other diabetic kidney complication: Secondary | ICD-10-CM | POA: Diagnosis not present

## 2024-01-21 DIAGNOSIS — Z794 Long term (current) use of insulin: Secondary | ICD-10-CM | POA: Diagnosis not present

## 2024-01-24 ENCOUNTER — Other Ambulatory Visit: Payer: Self-pay | Admitting: Internal Medicine

## 2024-01-24 DIAGNOSIS — Z1231 Encounter for screening mammogram for malignant neoplasm of breast: Secondary | ICD-10-CM

## 2024-03-05 DIAGNOSIS — H524 Presbyopia: Secondary | ICD-10-CM | POA: Diagnosis not present

## 2024-03-05 DIAGNOSIS — H52203 Unspecified astigmatism, bilateral: Secondary | ICD-10-CM | POA: Diagnosis not present

## 2024-03-05 DIAGNOSIS — E119 Type 2 diabetes mellitus without complications: Secondary | ICD-10-CM | POA: Diagnosis not present

## 2024-03-05 DIAGNOSIS — H401132 Primary open-angle glaucoma, bilateral, moderate stage: Secondary | ICD-10-CM | POA: Diagnosis not present

## 2024-03-05 DIAGNOSIS — Z961 Presence of intraocular lens: Secondary | ICD-10-CM | POA: Diagnosis not present

## 2024-03-05 DIAGNOSIS — H43813 Vitreous degeneration, bilateral: Secondary | ICD-10-CM | POA: Diagnosis not present

## 2024-03-16 DIAGNOSIS — E1129 Type 2 diabetes mellitus with other diabetic kidney complication: Secondary | ICD-10-CM | POA: Diagnosis not present

## 2024-03-17 ENCOUNTER — Ambulatory Visit

## 2024-03-18 ENCOUNTER — Encounter: Payer: Self-pay | Admitting: Podiatry

## 2024-03-18 ENCOUNTER — Ambulatory Visit: Admitting: Podiatry

## 2024-03-18 DIAGNOSIS — Q828 Other specified congenital malformations of skin: Secondary | ICD-10-CM

## 2024-03-18 DIAGNOSIS — M79675 Pain in left toe(s): Secondary | ICD-10-CM

## 2024-03-18 DIAGNOSIS — E119 Type 2 diabetes mellitus without complications: Secondary | ICD-10-CM | POA: Diagnosis not present

## 2024-03-18 DIAGNOSIS — B351 Tinea unguium: Secondary | ICD-10-CM | POA: Diagnosis not present

## 2024-03-18 DIAGNOSIS — M79674 Pain in right toe(s): Secondary | ICD-10-CM | POA: Diagnosis not present

## 2024-03-18 NOTE — Progress Notes (Signed)
 This patient returns to my office for at risk foot care.  This patient requires this care by a professional since this patient will be at risk due to having diabetes .  This patient is trim her callus both feet. These callus are painful walking and wearing shoes.  Patient has thick painful nails both feet.  This patient presents for at risk foot care today.    General Appearance  Alert, conversant and in no acute stress.  Vascular  Dorsalis pedis and posterior tibial  pulses are palpable  bilaterally.  Capillary return is within normal limits  bilaterally. Temperature is within normal limits  bilaterally.  Neurologic  Senn-Weinstein monofilament wire test within normal limits  bilaterally. Muscle power within normal limits bilaterally.  Nails Thick disfigured discolored nails with subungual debris  from hallux to fifth toes bilaterally. No evidence of bacterial infection or drainage bilaterally.  Orthopedic  No limitations of motion  feet .  No crepitus or effusions noted.  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  PLantar flexed third metatarsal third  B/L. Palpable sinus tarsi pain right ankle.  Skin  normotropic skin bilaterally. Porokeratosis sub 3  B/L . No signs of infections or ulcers noted.     Onychomycosis  B/L,  Porokeratosis  B/L.  Consent was obtained for treatment procedures.  Debridement of nails with nail nipper and dremel tool.  Debride porokeratosis with # 15 blade and dremel tool. Gave gel dispersion sheet.   Return office visit    6  months.                   Told patient to return for periodic foot care and evaluation due to potential at risk complications.   Ruffin Cotton DPM

## 2024-03-23 DIAGNOSIS — E1129 Type 2 diabetes mellitus with other diabetic kidney complication: Secondary | ICD-10-CM | POA: Diagnosis not present

## 2024-03-23 DIAGNOSIS — I130 Hypertensive heart and chronic kidney disease with heart failure and stage 1 through stage 4 chronic kidney disease, or unspecified chronic kidney disease: Secondary | ICD-10-CM | POA: Diagnosis not present

## 2024-03-23 DIAGNOSIS — F39 Unspecified mood [affective] disorder: Secondary | ICD-10-CM | POA: Diagnosis not present

## 2024-03-23 DIAGNOSIS — E785 Hyperlipidemia, unspecified: Secondary | ICD-10-CM | POA: Diagnosis not present

## 2024-03-23 DIAGNOSIS — I5030 Unspecified diastolic (congestive) heart failure: Secondary | ICD-10-CM | POA: Diagnosis not present

## 2024-03-23 DIAGNOSIS — N1832 Chronic kidney disease, stage 3b: Secondary | ICD-10-CM | POA: Diagnosis not present

## 2024-03-23 DIAGNOSIS — C50919 Malignant neoplasm of unspecified site of unspecified female breast: Secondary | ICD-10-CM | POA: Diagnosis not present

## 2024-04-07 ENCOUNTER — Ambulatory Visit

## 2024-04-16 ENCOUNTER — Ambulatory Visit

## 2024-04-17 ENCOUNTER — Encounter: Payer: Self-pay | Admitting: Advanced Practice Midwife

## 2024-04-29 DIAGNOSIS — H401132 Primary open-angle glaucoma, bilateral, moderate stage: Secondary | ICD-10-CM | POA: Diagnosis not present

## 2024-05-05 DIAGNOSIS — I129 Hypertensive chronic kidney disease with stage 1 through stage 4 chronic kidney disease, or unspecified chronic kidney disease: Secondary | ICD-10-CM | POA: Diagnosis not present

## 2024-05-05 DIAGNOSIS — E785 Hyperlipidemia, unspecified: Secondary | ICD-10-CM | POA: Diagnosis not present

## 2024-05-05 DIAGNOSIS — Z794 Long term (current) use of insulin: Secondary | ICD-10-CM | POA: Diagnosis not present

## 2024-05-05 DIAGNOSIS — N1832 Chronic kidney disease, stage 3b: Secondary | ICD-10-CM | POA: Diagnosis not present

## 2024-05-05 DIAGNOSIS — E669 Obesity, unspecified: Secondary | ICD-10-CM | POA: Diagnosis not present

## 2024-05-05 DIAGNOSIS — E1129 Type 2 diabetes mellitus with other diabetic kidney complication: Secondary | ICD-10-CM | POA: Diagnosis not present

## 2024-05-08 ENCOUNTER — Ambulatory Visit
Admission: RE | Admit: 2024-05-08 | Discharge: 2024-05-08 | Disposition: A | Discharge status: Home / Self Care | Source: Ambulatory Visit | Attending: Internal Medicine | Admitting: Internal Medicine

## 2024-05-08 DIAGNOSIS — Z1231 Encounter for screening mammogram for malignant neoplasm of breast: Secondary | ICD-10-CM

## 2024-06-05 DIAGNOSIS — H409 Unspecified glaucoma: Secondary | ICD-10-CM | POA: Diagnosis not present

## 2024-06-05 DIAGNOSIS — N1832 Chronic kidney disease, stage 3b: Secondary | ICD-10-CM | POA: Diagnosis not present

## 2024-06-05 DIAGNOSIS — E1122 Type 2 diabetes mellitus with diabetic chronic kidney disease: Secondary | ICD-10-CM | POA: Diagnosis not present

## 2024-06-05 DIAGNOSIS — F39 Unspecified mood [affective] disorder: Secondary | ICD-10-CM | POA: Diagnosis not present

## 2024-06-05 DIAGNOSIS — C50919 Malignant neoplasm of unspecified site of unspecified female breast: Secondary | ICD-10-CM | POA: Diagnosis not present

## 2024-06-05 DIAGNOSIS — I5030 Unspecified diastolic (congestive) heart failure: Secondary | ICD-10-CM | POA: Diagnosis not present

## 2024-06-05 DIAGNOSIS — Z23 Encounter for immunization: Secondary | ICD-10-CM | POA: Diagnosis not present

## 2024-06-05 DIAGNOSIS — I130 Hypertensive heart and chronic kidney disease with heart failure and stage 1 through stage 4 chronic kidney disease, or unspecified chronic kidney disease: Secondary | ICD-10-CM | POA: Diagnosis not present

## 2024-06-14 DIAGNOSIS — E1129 Type 2 diabetes mellitus with other diabetic kidney complication: Secondary | ICD-10-CM | POA: Diagnosis not present

## 2024-09-12 DIAGNOSIS — E1129 Type 2 diabetes mellitus with other diabetic kidney complication: Secondary | ICD-10-CM | POA: Diagnosis not present

## 2024-09-21 ENCOUNTER — Ambulatory Visit: Admitting: Podiatry

## 2024-10-21 ENCOUNTER — Ambulatory Visit: Admitting: Podiatry

## 2024-10-30 ENCOUNTER — Ambulatory Visit: Admitting: Podiatry

## 2024-11-05 ENCOUNTER — Encounter: Payer: Self-pay | Admitting: Gastroenterology

## 2024-12-09 ENCOUNTER — Encounter

## 2024-12-23 ENCOUNTER — Encounter: Admitting: Gastroenterology

## 2024-12-24 ENCOUNTER — Ambulatory Visit: Admitting: Podiatry
